# Patient Record
Sex: Male | Born: 1955 | Race: White | Hispanic: No | Marital: Married | Smoking: Former smoker
Health system: Southern US, Community
[De-identification: ages and names within clinical notes are randomized; demographics above are authoritative.]

## PROBLEM LIST (undated history)

## (undated) DIAGNOSIS — N400 Enlarged prostate without lower urinary tract symptoms: Secondary | ICD-10-CM

## (undated) DIAGNOSIS — R7303 Prediabetes: Secondary | ICD-10-CM

## (undated) DIAGNOSIS — C4490 Unspecified malignant neoplasm of skin, unspecified: Secondary | ICD-10-CM

## (undated) DIAGNOSIS — N529 Male erectile dysfunction, unspecified: Secondary | ICD-10-CM

## (undated) DIAGNOSIS — J302 Other seasonal allergic rhinitis: Secondary | ICD-10-CM

## (undated) DIAGNOSIS — I493 Ventricular premature depolarization: Secondary | ICD-10-CM

## (undated) DIAGNOSIS — H5440 Blindness, one eye, unspecified eye: Secondary | ICD-10-CM

## (undated) DIAGNOSIS — M4802 Spinal stenosis, cervical region: Secondary | ICD-10-CM

## (undated) DIAGNOSIS — Z87898 Personal history of other specified conditions: Secondary | ICD-10-CM

## (undated) DIAGNOSIS — R001 Bradycardia, unspecified: Secondary | ICD-10-CM

## (undated) DIAGNOSIS — Z973 Presence of spectacles and contact lenses: Secondary | ICD-10-CM

## (undated) DIAGNOSIS — M75101 Unspecified rotator cuff tear or rupture of right shoulder, not specified as traumatic: Secondary | ICD-10-CM

## (undated) DIAGNOSIS — E785 Hyperlipidemia, unspecified: Secondary | ICD-10-CM

## (undated) DIAGNOSIS — K429 Umbilical hernia without obstruction or gangrene: Secondary | ICD-10-CM

## (undated) DIAGNOSIS — M1990 Unspecified osteoarthritis, unspecified site: Secondary | ICD-10-CM

## (undated) DIAGNOSIS — T4145XA Adverse effect of unspecified anesthetic, initial encounter: Secondary | ICD-10-CM

## (undated) DIAGNOSIS — I491 Atrial premature depolarization: Secondary | ICD-10-CM

## (undated) DIAGNOSIS — I499 Cardiac arrhythmia, unspecified: Secondary | ICD-10-CM

## (undated) DIAGNOSIS — T8859XA Other complications of anesthesia, initial encounter: Secondary | ICD-10-CM

## (undated) HISTORY — PX: COLONOSCOPY: SHX174

## (undated) HISTORY — PX: EYE SURGERY: SHX253

## (undated) HISTORY — PX: TRANSTHORACIC ECHOCARDIOGRAM: SHX275

## (undated) HISTORY — DX: Benign prostatic hyperplasia without lower urinary tract symptoms: N40.0

## (undated) HISTORY — DX: Male erectile dysfunction, unspecified: N52.9

## (undated) HISTORY — DX: Bradycardia, unspecified: R00.1

## (undated) HISTORY — DX: Prediabetes: R73.03

## (undated) HISTORY — PX: CARDIOVASCULAR STRESS TEST: SHX262

## (undated) HISTORY — PX: KNEE ARTHROSCOPY: SUR90

## (undated) HISTORY — PX: LUMBAR DISC SURGERY: SHX700

## (undated) HISTORY — DX: Hyperlipidemia, unspecified: E78.5

## (undated) HISTORY — DX: Blindness, one eye, unspecified eye: H54.40

## (undated) HISTORY — DX: Cardiac arrhythmia, unspecified: I49.9

---

## 2004-05-14 ENCOUNTER — Ambulatory Visit (HOSPITAL_COMMUNITY): Admission: RE | Admit: 2004-05-14 | Discharge: 2004-05-14 | Payer: Self-pay | Admitting: Specialist

## 2016-03-17 DIAGNOSIS — R7301 Impaired fasting glucose: Secondary | ICD-10-CM | POA: Diagnosis not present

## 2016-03-17 DIAGNOSIS — E785 Hyperlipidemia, unspecified: Secondary | ICD-10-CM | POA: Diagnosis not present

## 2016-03-17 DIAGNOSIS — Z1211 Encounter for screening for malignant neoplasm of colon: Secondary | ICD-10-CM | POA: Diagnosis not present

## 2016-03-17 DIAGNOSIS — N529 Male erectile dysfunction, unspecified: Secondary | ICD-10-CM | POA: Diagnosis not present

## 2016-03-17 DIAGNOSIS — Z0000 Encounter for general adult medical examination without abnormal findings: Secondary | ICD-10-CM | POA: Diagnosis not present

## 2016-03-17 DIAGNOSIS — R001 Bradycardia, unspecified: Secondary | ICD-10-CM | POA: Diagnosis not present

## 2016-03-17 DIAGNOSIS — Z125 Encounter for screening for malignant neoplasm of prostate: Secondary | ICD-10-CM | POA: Diagnosis not present

## 2016-05-20 DIAGNOSIS — M542 Cervicalgia: Secondary | ICD-10-CM | POA: Diagnosis not present

## 2016-05-20 DIAGNOSIS — M546 Pain in thoracic spine: Secondary | ICD-10-CM | POA: Diagnosis not present

## 2016-06-11 ENCOUNTER — Other Ambulatory Visit: Payer: Self-pay | Admitting: Sports Medicine

## 2016-06-11 DIAGNOSIS — M542 Cervicalgia: Secondary | ICD-10-CM

## 2016-06-21 ENCOUNTER — Ambulatory Visit
Admission: RE | Admit: 2016-06-21 | Discharge: 2016-06-21 | Disposition: A | Discharge status: Home / Self Care | Payer: Self-pay | Source: Ambulatory Visit | Attending: Sports Medicine | Admitting: Sports Medicine

## 2016-06-21 DIAGNOSIS — M542 Cervicalgia: Secondary | ICD-10-CM

## 2016-07-01 DIAGNOSIS — M542 Cervicalgia: Secondary | ICD-10-CM | POA: Diagnosis not present

## 2016-07-01 DIAGNOSIS — M546 Pain in thoracic spine: Secondary | ICD-10-CM | POA: Diagnosis not present

## 2016-07-01 DIAGNOSIS — M9981 Other biomechanical lesions of cervical region: Secondary | ICD-10-CM | POA: Diagnosis not present

## 2016-08-01 DIAGNOSIS — M50822 Other cervical disc disorders at C5-C6 level: Secondary | ICD-10-CM | POA: Diagnosis not present

## 2016-08-01 DIAGNOSIS — M4802 Spinal stenosis, cervical region: Secondary | ICD-10-CM | POA: Diagnosis not present

## 2016-08-01 DIAGNOSIS — M50823 Other cervical disc disorders at C6-C7 level: Secondary | ICD-10-CM | POA: Diagnosis not present

## 2016-08-01 DIAGNOSIS — M50821 Other cervical disc disorders at C4-C5 level: Secondary | ICD-10-CM | POA: Diagnosis not present

## 2016-08-12 DIAGNOSIS — M542 Cervicalgia: Secondary | ICD-10-CM | POA: Diagnosis not present

## 2016-08-17 DIAGNOSIS — M542 Cervicalgia: Secondary | ICD-10-CM | POA: Diagnosis not present

## 2016-08-19 DIAGNOSIS — M4802 Spinal stenosis, cervical region: Secondary | ICD-10-CM | POA: Diagnosis not present

## 2016-08-21 DIAGNOSIS — M542 Cervicalgia: Secondary | ICD-10-CM | POA: Diagnosis not present

## 2016-11-24 DIAGNOSIS — M722 Plantar fascial fibromatosis: Secondary | ICD-10-CM | POA: Diagnosis not present

## 2016-12-08 DIAGNOSIS — M9901 Segmental and somatic dysfunction of cervical region: Secondary | ICD-10-CM | POA: Diagnosis not present

## 2016-12-08 DIAGNOSIS — M50322 Other cervical disc degeneration at C5-C6 level: Secondary | ICD-10-CM | POA: Diagnosis not present

## 2016-12-08 DIAGNOSIS — Q72812 Congenital shortening of left lower limb: Secondary | ICD-10-CM | POA: Diagnosis not present

## 2016-12-08 DIAGNOSIS — M9905 Segmental and somatic dysfunction of pelvic region: Secondary | ICD-10-CM | POA: Diagnosis not present

## 2016-12-09 DIAGNOSIS — M50322 Other cervical disc degeneration at C5-C6 level: Secondary | ICD-10-CM | POA: Diagnosis not present

## 2016-12-09 DIAGNOSIS — Q72812 Congenital shortening of left lower limb: Secondary | ICD-10-CM | POA: Diagnosis not present

## 2016-12-09 DIAGNOSIS — M9905 Segmental and somatic dysfunction of pelvic region: Secondary | ICD-10-CM | POA: Diagnosis not present

## 2016-12-09 DIAGNOSIS — M9901 Segmental and somatic dysfunction of cervical region: Secondary | ICD-10-CM | POA: Diagnosis not present

## 2016-12-10 DIAGNOSIS — M50322 Other cervical disc degeneration at C5-C6 level: Secondary | ICD-10-CM | POA: Diagnosis not present

## 2016-12-10 DIAGNOSIS — M9905 Segmental and somatic dysfunction of pelvic region: Secondary | ICD-10-CM | POA: Diagnosis not present

## 2016-12-10 DIAGNOSIS — Q72812 Congenital shortening of left lower limb: Secondary | ICD-10-CM | POA: Diagnosis not present

## 2016-12-10 DIAGNOSIS — M9901 Segmental and somatic dysfunction of cervical region: Secondary | ICD-10-CM | POA: Diagnosis not present

## 2016-12-11 DIAGNOSIS — M9901 Segmental and somatic dysfunction of cervical region: Secondary | ICD-10-CM | POA: Diagnosis not present

## 2016-12-11 DIAGNOSIS — Q72812 Congenital shortening of left lower limb: Secondary | ICD-10-CM | POA: Diagnosis not present

## 2016-12-11 DIAGNOSIS — M9905 Segmental and somatic dysfunction of pelvic region: Secondary | ICD-10-CM | POA: Diagnosis not present

## 2016-12-11 DIAGNOSIS — M50322 Other cervical disc degeneration at C5-C6 level: Secondary | ICD-10-CM | POA: Diagnosis not present

## 2016-12-14 DIAGNOSIS — Q72812 Congenital shortening of left lower limb: Secondary | ICD-10-CM | POA: Diagnosis not present

## 2016-12-14 DIAGNOSIS — M50322 Other cervical disc degeneration at C5-C6 level: Secondary | ICD-10-CM | POA: Diagnosis not present

## 2016-12-14 DIAGNOSIS — M9905 Segmental and somatic dysfunction of pelvic region: Secondary | ICD-10-CM | POA: Diagnosis not present

## 2016-12-14 DIAGNOSIS — M9901 Segmental and somatic dysfunction of cervical region: Secondary | ICD-10-CM | POA: Diagnosis not present

## 2016-12-15 DIAGNOSIS — M50322 Other cervical disc degeneration at C5-C6 level: Secondary | ICD-10-CM | POA: Diagnosis not present

## 2016-12-15 DIAGNOSIS — M9905 Segmental and somatic dysfunction of pelvic region: Secondary | ICD-10-CM | POA: Diagnosis not present

## 2016-12-15 DIAGNOSIS — Q72812 Congenital shortening of left lower limb: Secondary | ICD-10-CM | POA: Diagnosis not present

## 2016-12-15 DIAGNOSIS — M9901 Segmental and somatic dysfunction of cervical region: Secondary | ICD-10-CM | POA: Diagnosis not present

## 2016-12-18 DIAGNOSIS — Q72812 Congenital shortening of left lower limb: Secondary | ICD-10-CM | POA: Diagnosis not present

## 2016-12-18 DIAGNOSIS — M50322 Other cervical disc degeneration at C5-C6 level: Secondary | ICD-10-CM | POA: Diagnosis not present

## 2016-12-18 DIAGNOSIS — M9901 Segmental and somatic dysfunction of cervical region: Secondary | ICD-10-CM | POA: Diagnosis not present

## 2016-12-18 DIAGNOSIS — M9905 Segmental and somatic dysfunction of pelvic region: Secondary | ICD-10-CM | POA: Diagnosis not present

## 2016-12-21 DIAGNOSIS — M50322 Other cervical disc degeneration at C5-C6 level: Secondary | ICD-10-CM | POA: Diagnosis not present

## 2016-12-21 DIAGNOSIS — M9905 Segmental and somatic dysfunction of pelvic region: Secondary | ICD-10-CM | POA: Diagnosis not present

## 2016-12-21 DIAGNOSIS — Q72812 Congenital shortening of left lower limb: Secondary | ICD-10-CM | POA: Diagnosis not present

## 2016-12-21 DIAGNOSIS — M9901 Segmental and somatic dysfunction of cervical region: Secondary | ICD-10-CM | POA: Diagnosis not present

## 2016-12-22 DIAGNOSIS — M9901 Segmental and somatic dysfunction of cervical region: Secondary | ICD-10-CM | POA: Diagnosis not present

## 2016-12-22 DIAGNOSIS — M50322 Other cervical disc degeneration at C5-C6 level: Secondary | ICD-10-CM | POA: Diagnosis not present

## 2016-12-22 DIAGNOSIS — Q72812 Congenital shortening of left lower limb: Secondary | ICD-10-CM | POA: Diagnosis not present

## 2016-12-22 DIAGNOSIS — M9905 Segmental and somatic dysfunction of pelvic region: Secondary | ICD-10-CM | POA: Diagnosis not present

## 2016-12-25 DIAGNOSIS — M9901 Segmental and somatic dysfunction of cervical region: Secondary | ICD-10-CM | POA: Diagnosis not present

## 2016-12-25 DIAGNOSIS — M50322 Other cervical disc degeneration at C5-C6 level: Secondary | ICD-10-CM | POA: Diagnosis not present

## 2016-12-25 DIAGNOSIS — M9905 Segmental and somatic dysfunction of pelvic region: Secondary | ICD-10-CM | POA: Diagnosis not present

## 2016-12-25 DIAGNOSIS — Q72812 Congenital shortening of left lower limb: Secondary | ICD-10-CM | POA: Diagnosis not present

## 2016-12-28 DIAGNOSIS — M9905 Segmental and somatic dysfunction of pelvic region: Secondary | ICD-10-CM | POA: Diagnosis not present

## 2016-12-28 DIAGNOSIS — M9901 Segmental and somatic dysfunction of cervical region: Secondary | ICD-10-CM | POA: Diagnosis not present

## 2016-12-28 DIAGNOSIS — M50322 Other cervical disc degeneration at C5-C6 level: Secondary | ICD-10-CM | POA: Diagnosis not present

## 2016-12-28 DIAGNOSIS — Q72812 Congenital shortening of left lower limb: Secondary | ICD-10-CM | POA: Diagnosis not present

## 2016-12-29 DIAGNOSIS — Q72812 Congenital shortening of left lower limb: Secondary | ICD-10-CM | POA: Diagnosis not present

## 2016-12-29 DIAGNOSIS — M9901 Segmental and somatic dysfunction of cervical region: Secondary | ICD-10-CM | POA: Diagnosis not present

## 2016-12-29 DIAGNOSIS — M50322 Other cervical disc degeneration at C5-C6 level: Secondary | ICD-10-CM | POA: Diagnosis not present

## 2016-12-29 DIAGNOSIS — M9905 Segmental and somatic dysfunction of pelvic region: Secondary | ICD-10-CM | POA: Diagnosis not present

## 2017-01-01 DIAGNOSIS — M9901 Segmental and somatic dysfunction of cervical region: Secondary | ICD-10-CM | POA: Diagnosis not present

## 2017-01-01 DIAGNOSIS — M50322 Other cervical disc degeneration at C5-C6 level: Secondary | ICD-10-CM | POA: Diagnosis not present

## 2017-01-01 DIAGNOSIS — Q72812 Congenital shortening of left lower limb: Secondary | ICD-10-CM | POA: Diagnosis not present

## 2017-01-01 DIAGNOSIS — M9905 Segmental and somatic dysfunction of pelvic region: Secondary | ICD-10-CM | POA: Diagnosis not present

## 2017-01-05 DIAGNOSIS — M9901 Segmental and somatic dysfunction of cervical region: Secondary | ICD-10-CM | POA: Diagnosis not present

## 2017-01-05 DIAGNOSIS — Q72812 Congenital shortening of left lower limb: Secondary | ICD-10-CM | POA: Diagnosis not present

## 2017-01-05 DIAGNOSIS — M50322 Other cervical disc degeneration at C5-C6 level: Secondary | ICD-10-CM | POA: Diagnosis not present

## 2017-01-05 DIAGNOSIS — M9905 Segmental and somatic dysfunction of pelvic region: Secondary | ICD-10-CM | POA: Diagnosis not present

## 2017-01-08 DIAGNOSIS — Q72812 Congenital shortening of left lower limb: Secondary | ICD-10-CM | POA: Diagnosis not present

## 2017-01-08 DIAGNOSIS — M9901 Segmental and somatic dysfunction of cervical region: Secondary | ICD-10-CM | POA: Diagnosis not present

## 2017-01-08 DIAGNOSIS — M9905 Segmental and somatic dysfunction of pelvic region: Secondary | ICD-10-CM | POA: Diagnosis not present

## 2017-01-08 DIAGNOSIS — M50322 Other cervical disc degeneration at C5-C6 level: Secondary | ICD-10-CM | POA: Diagnosis not present

## 2017-01-12 DIAGNOSIS — M9901 Segmental and somatic dysfunction of cervical region: Secondary | ICD-10-CM | POA: Diagnosis not present

## 2017-01-12 DIAGNOSIS — M50322 Other cervical disc degeneration at C5-C6 level: Secondary | ICD-10-CM | POA: Diagnosis not present

## 2017-01-12 DIAGNOSIS — M9905 Segmental and somatic dysfunction of pelvic region: Secondary | ICD-10-CM | POA: Diagnosis not present

## 2017-01-12 DIAGNOSIS — Q72812 Congenital shortening of left lower limb: Secondary | ICD-10-CM | POA: Diagnosis not present

## 2017-01-15 DIAGNOSIS — M50322 Other cervical disc degeneration at C5-C6 level: Secondary | ICD-10-CM | POA: Diagnosis not present

## 2017-01-15 DIAGNOSIS — Q72812 Congenital shortening of left lower limb: Secondary | ICD-10-CM | POA: Diagnosis not present

## 2017-01-15 DIAGNOSIS — M9901 Segmental and somatic dysfunction of cervical region: Secondary | ICD-10-CM | POA: Diagnosis not present

## 2017-01-15 DIAGNOSIS — M9905 Segmental and somatic dysfunction of pelvic region: Secondary | ICD-10-CM | POA: Diagnosis not present

## 2017-01-22 DIAGNOSIS — M9901 Segmental and somatic dysfunction of cervical region: Secondary | ICD-10-CM | POA: Diagnosis not present

## 2017-01-22 DIAGNOSIS — M9905 Segmental and somatic dysfunction of pelvic region: Secondary | ICD-10-CM | POA: Diagnosis not present

## 2017-01-22 DIAGNOSIS — Q72812 Congenital shortening of left lower limb: Secondary | ICD-10-CM | POA: Diagnosis not present

## 2017-01-22 DIAGNOSIS — M50322 Other cervical disc degeneration at C5-C6 level: Secondary | ICD-10-CM | POA: Diagnosis not present

## 2017-01-28 DIAGNOSIS — M9901 Segmental and somatic dysfunction of cervical region: Secondary | ICD-10-CM | POA: Diagnosis not present

## 2017-01-28 DIAGNOSIS — Q72812 Congenital shortening of left lower limb: Secondary | ICD-10-CM | POA: Diagnosis not present

## 2017-01-28 DIAGNOSIS — M9905 Segmental and somatic dysfunction of pelvic region: Secondary | ICD-10-CM | POA: Diagnosis not present

## 2017-01-28 DIAGNOSIS — M50322 Other cervical disc degeneration at C5-C6 level: Secondary | ICD-10-CM | POA: Diagnosis not present

## 2017-02-02 DIAGNOSIS — M9905 Segmental and somatic dysfunction of pelvic region: Secondary | ICD-10-CM | POA: Diagnosis not present

## 2017-02-02 DIAGNOSIS — M50322 Other cervical disc degeneration at C5-C6 level: Secondary | ICD-10-CM | POA: Diagnosis not present

## 2017-02-02 DIAGNOSIS — M9901 Segmental and somatic dysfunction of cervical region: Secondary | ICD-10-CM | POA: Diagnosis not present

## 2017-02-02 DIAGNOSIS — Q72812 Congenital shortening of left lower limb: Secondary | ICD-10-CM | POA: Diagnosis not present

## 2017-02-09 DIAGNOSIS — M9905 Segmental and somatic dysfunction of pelvic region: Secondary | ICD-10-CM | POA: Diagnosis not present

## 2017-02-09 DIAGNOSIS — Q72812 Congenital shortening of left lower limb: Secondary | ICD-10-CM | POA: Diagnosis not present

## 2017-02-09 DIAGNOSIS — M50322 Other cervical disc degeneration at C5-C6 level: Secondary | ICD-10-CM | POA: Diagnosis not present

## 2017-02-09 DIAGNOSIS — M9901 Segmental and somatic dysfunction of cervical region: Secondary | ICD-10-CM | POA: Diagnosis not present

## 2017-02-22 DIAGNOSIS — M9901 Segmental and somatic dysfunction of cervical region: Secondary | ICD-10-CM | POA: Diagnosis not present

## 2017-02-22 DIAGNOSIS — M9905 Segmental and somatic dysfunction of pelvic region: Secondary | ICD-10-CM | POA: Diagnosis not present

## 2017-02-22 DIAGNOSIS — Q72812 Congenital shortening of left lower limb: Secondary | ICD-10-CM | POA: Diagnosis not present

## 2017-02-22 DIAGNOSIS — M50322 Other cervical disc degeneration at C5-C6 level: Secondary | ICD-10-CM | POA: Diagnosis not present

## 2017-03-19 DIAGNOSIS — R030 Elevated blood-pressure reading, without diagnosis of hypertension: Secondary | ICD-10-CM | POA: Diagnosis not present

## 2017-03-19 DIAGNOSIS — Z0000 Encounter for general adult medical examination without abnormal findings: Secondary | ICD-10-CM | POA: Diagnosis not present

## 2017-03-19 DIAGNOSIS — Z1211 Encounter for screening for malignant neoplasm of colon: Secondary | ICD-10-CM | POA: Diagnosis not present

## 2017-03-19 DIAGNOSIS — R7303 Prediabetes: Secondary | ICD-10-CM | POA: Diagnosis not present

## 2017-03-19 DIAGNOSIS — Z125 Encounter for screening for malignant neoplasm of prostate: Secondary | ICD-10-CM | POA: Diagnosis not present

## 2017-03-19 DIAGNOSIS — E7800 Pure hypercholesterolemia, unspecified: Secondary | ICD-10-CM | POA: Diagnosis not present

## 2017-03-19 DIAGNOSIS — N529 Male erectile dysfunction, unspecified: Secondary | ICD-10-CM | POA: Diagnosis not present

## 2017-03-29 DIAGNOSIS — M5412 Radiculopathy, cervical region: Secondary | ICD-10-CM | POA: Diagnosis not present

## 2017-03-29 DIAGNOSIS — M542 Cervicalgia: Secondary | ICD-10-CM | POA: Diagnosis not present

## 2017-03-30 ENCOUNTER — Other Ambulatory Visit: Payer: Self-pay | Admitting: Neurosurgery

## 2017-03-30 DIAGNOSIS — M5412 Radiculopathy, cervical region: Secondary | ICD-10-CM

## 2017-04-09 ENCOUNTER — Ambulatory Visit
Admission: RE | Admit: 2017-04-09 | Discharge: 2017-04-09 | Disposition: A | Discharge status: Home / Self Care | Payer: BLUE CROSS/BLUE SHIELD | Source: Ambulatory Visit | Attending: Neurosurgery | Admitting: Neurosurgery

## 2017-04-09 DIAGNOSIS — M5412 Radiculopathy, cervical region: Secondary | ICD-10-CM

## 2017-04-09 DIAGNOSIS — M4802 Spinal stenosis, cervical region: Secondary | ICD-10-CM | POA: Diagnosis not present

## 2017-04-20 DIAGNOSIS — M5412 Radiculopathy, cervical region: Secondary | ICD-10-CM | POA: Diagnosis not present

## 2017-05-04 DIAGNOSIS — M542 Cervicalgia: Secondary | ICD-10-CM | POA: Diagnosis not present

## 2017-05-04 DIAGNOSIS — M6281 Muscle weakness (generalized): Secondary | ICD-10-CM | POA: Diagnosis not present

## 2017-05-04 DIAGNOSIS — M79602 Pain in left arm: Secondary | ICD-10-CM | POA: Diagnosis not present

## 2017-05-06 DIAGNOSIS — M79602 Pain in left arm: Secondary | ICD-10-CM | POA: Diagnosis not present

## 2017-05-06 DIAGNOSIS — M542 Cervicalgia: Secondary | ICD-10-CM | POA: Diagnosis not present

## 2017-05-06 DIAGNOSIS — M6281 Muscle weakness (generalized): Secondary | ICD-10-CM | POA: Diagnosis not present

## 2017-05-11 DIAGNOSIS — M79602 Pain in left arm: Secondary | ICD-10-CM | POA: Diagnosis not present

## 2017-05-11 DIAGNOSIS — H2511 Age-related nuclear cataract, right eye: Secondary | ICD-10-CM | POA: Diagnosis not present

## 2017-05-11 DIAGNOSIS — M542 Cervicalgia: Secondary | ICD-10-CM | POA: Diagnosis not present

## 2017-05-11 DIAGNOSIS — M6281 Muscle weakness (generalized): Secondary | ICD-10-CM | POA: Diagnosis not present

## 2017-05-25 DIAGNOSIS — M5412 Radiculopathy, cervical region: Secondary | ICD-10-CM | POA: Diagnosis not present

## 2017-06-15 DIAGNOSIS — M50821 Other cervical disc disorders at C4-C5 level: Secondary | ICD-10-CM | POA: Diagnosis not present

## 2017-06-15 DIAGNOSIS — M542 Cervicalgia: Secondary | ICD-10-CM | POA: Diagnosis not present

## 2017-06-23 DIAGNOSIS — R001 Bradycardia, unspecified: Secondary | ICD-10-CM | POA: Diagnosis not present

## 2017-06-23 DIAGNOSIS — I493 Ventricular premature depolarization: Secondary | ICD-10-CM | POA: Diagnosis not present

## 2017-06-23 DIAGNOSIS — I499 Cardiac arrhythmia, unspecified: Secondary | ICD-10-CM | POA: Diagnosis not present

## 2017-07-01 DIAGNOSIS — M50322 Other cervical disc degeneration at C5-C6 level: Secondary | ICD-10-CM | POA: Diagnosis not present

## 2017-07-01 DIAGNOSIS — M9901 Segmental and somatic dysfunction of cervical region: Secondary | ICD-10-CM | POA: Diagnosis not present

## 2017-07-01 DIAGNOSIS — M9905 Segmental and somatic dysfunction of pelvic region: Secondary | ICD-10-CM | POA: Diagnosis not present

## 2017-07-01 DIAGNOSIS — Q72812 Congenital shortening of left lower limb: Secondary | ICD-10-CM | POA: Diagnosis not present

## 2017-07-08 DIAGNOSIS — M542 Cervicalgia: Secondary | ICD-10-CM | POA: Diagnosis not present

## 2017-07-08 DIAGNOSIS — M5412 Radiculopathy, cervical region: Secondary | ICD-10-CM | POA: Diagnosis not present

## 2017-07-13 DIAGNOSIS — I517 Cardiomegaly: Secondary | ICD-10-CM

## 2017-07-13 DIAGNOSIS — I340 Nonrheumatic mitral (valve) insufficiency: Secondary | ICD-10-CM

## 2017-07-13 HISTORY — PX: ANTERIOR CERVICAL DECOMP/DISCECTOMY FUSION: SHX1161

## 2017-07-13 HISTORY — DX: Cardiomegaly: I51.7

## 2017-07-13 HISTORY — DX: Nonrheumatic mitral (valve) insufficiency: I34.0

## 2017-07-30 DIAGNOSIS — M50223 Other cervical disc displacement at C6-C7 level: Secondary | ICD-10-CM | POA: Diagnosis not present

## 2017-07-30 DIAGNOSIS — M4802 Spinal stenosis, cervical region: Secondary | ICD-10-CM | POA: Diagnosis not present

## 2017-07-30 DIAGNOSIS — M4722 Other spondylosis with radiculopathy, cervical region: Secondary | ICD-10-CM | POA: Diagnosis not present

## 2017-08-05 DIAGNOSIS — R330 Drug induced retention of urine: Secondary | ICD-10-CM | POA: Diagnosis not present

## 2017-08-06 DIAGNOSIS — R330 Drug induced retention of urine: Secondary | ICD-10-CM | POA: Diagnosis not present

## 2017-08-10 DIAGNOSIS — R330 Drug induced retention of urine: Secondary | ICD-10-CM | POA: Diagnosis not present

## 2017-08-24 DIAGNOSIS — N5201 Erectile dysfunction due to arterial insufficiency: Secondary | ICD-10-CM | POA: Diagnosis not present

## 2017-08-24 DIAGNOSIS — Z125 Encounter for screening for malignant neoplasm of prostate: Secondary | ICD-10-CM | POA: Diagnosis not present

## 2017-08-24 DIAGNOSIS — R3914 Feeling of incomplete bladder emptying: Secondary | ICD-10-CM | POA: Diagnosis not present

## 2017-08-27 DIAGNOSIS — M542 Cervicalgia: Secondary | ICD-10-CM | POA: Diagnosis not present

## 2017-10-08 DIAGNOSIS — M25511 Pain in right shoulder: Secondary | ICD-10-CM | POA: Diagnosis not present

## 2017-10-08 DIAGNOSIS — M25562 Pain in left knee: Secondary | ICD-10-CM | POA: Diagnosis not present

## 2017-10-19 DIAGNOSIS — M25562 Pain in left knee: Secondary | ICD-10-CM | POA: Diagnosis not present

## 2017-10-23 DIAGNOSIS — M25562 Pain in left knee: Secondary | ICD-10-CM | POA: Diagnosis not present

## 2017-11-03 DIAGNOSIS — M2342 Loose body in knee, left knee: Secondary | ICD-10-CM | POA: Diagnosis not present

## 2017-11-03 DIAGNOSIS — M1712 Unilateral primary osteoarthritis, left knee: Secondary | ICD-10-CM | POA: Diagnosis not present

## 2017-11-11 NOTE — Progress Notes (Signed)
Cardiology Office Note   Date:  11/12/2017   ID:  Logan Diaz, DOB 1956/06/13, MRN [id number]  PCP:  Antony Contras, MD  Cardiologist:   Jenkins Rouge, MD   No chief complaint on file.     History of Present Illness: Logan Diaz is a 62 y.o. male who presents for consultation regarding palpitations. Sees Dr Moreen Fowler  As primary but appt indicates self referral History of prediabetes, HLD, BPH and PVC's on ECG He is asymptomatic and wife concerned more about hearing his irregular heart beat when her head is on his chest  I take care of his older brother Logan Diaz who has PAF and a non ischemic DCM.    Patient had cervical spine fusion with Logan Diaz and anesthesia noted PVC;s Was preop for left knee surgery with Dr Theda Sers and noted to have bigeminy and surgery cancelled  No chest pain, syncope palpitations dyspnea or other cardiac symptoms   Past Medical History:  Diagnosis Date  . 1 minute Apgar score 0   . Blindness of left eye    SECONDARY TO A MAGNOLIA TREE PRICKLY BUD ACCIDENT AT AGE 55  . BPH (benign prostatic hyperplasia)   . Bradycardia   . ED (erectile dysfunction)   . Elevated LDL cholesterol level   . Frequent PVCs   . Hyperlipidemia   . Irregular heart beat   . Prediabetes     Past Surgical History:  Procedure Laterality Date  . EYE SURGERY       Current Outpatient Medications  Medication Sig Dispense Refill  . finasteride (PROSCAR) 5 MG tablet Take 5 mg by mouth daily.  0  . fluticasone (FLONASE) 50 MCG/ACT nasal spray Place into both nostrils daily.    Marland Kitchen MAGNESIUM PO Take by mouth. 1-2 TABLETS BY MOUTH DAILY    . NON FORMULARY SUPPLEMENT APS III    . Omega-3 Fatty Acids (FISH OIL PO) Take by mouth. TAKE 2 CAPSULE DAILY    . sildenafil (REVATIO) 20 MG tablet 20 mg. TAKE 2-5 TABLETS ONCE A DAY AS NEEDED , ONE HOUR PRIOR TO SEX    . tamsulosin (FLOMAX) 0.4 MG CAPS capsule as directed.  0   No current facility-administered medications for this  visit.     Allergies:   Patient has no known allergies.    Social History:  The patient  reports that he has quit smoking. He has never used smokeless tobacco. He reports that he does not drink alcohol or use drugs.   Family History:  The patient's family history is not on file.    ROS:  Please see the history of present illness.   Otherwise, review of systems are positive for none.   All other systems are reviewed and negative.    PHYSICAL EXAM: VS:  BP (!) 148/82 Comment: had multiple cups of coffee today  Pulse (!) 44   Ht 6\' 1"  (1.854 m)   Wt 234 lb 8 oz (106.4 kg)   SpO2 96%   BMI 30.94 kg/m  , BMI Body mass index is 30.94 kg/m. Affect appropriate Healthy:  appears stated age 68: ptosis and blind left eye  Neck supple with no adenopathy JVP normal no bruits no thyromegaly Lungs clear with no wheezing and good diaphragmatic motion Heart:  S1/S2 no murmur, no rub, gallop or click PMI normal Abdomen: benighn, BS positve, no tenderness, no AAA no bruit.  No HSM or HJR Distal pulses intact with no bruits No edema Neuro  non-focal Skin warm and dry No muscular weakness    EKG:  SR bigeminy QT 400 normal ST segments    Recent Labs: No results found for requested labs within last 8760 hours.    Lipid Panel No results found for: CHOL, TRIG, HDL, CHOLHDL, VLDL, LDLCALC, LDLDIRECT    Wt Readings from Last 3 Encounters:  11/12/17 234 lb 8 oz (106.4 kg)      Other studies Reviewed: Additional studies/ records that were reviewed today include: Notes primary ECG Notes from anesthesia.    ASSESSMENT AND PLAN:  1.  PVC;s :  Although asymptomatic seem frequent and brother has history of non ischemic DCM.  Will order TTE and 48 hour holter. Will also order Exercise myovue to see if PVCls suppress or worsen with activity and r/o CAD Given left knee issue may need to convert to [facility] but patient would like to try and walk Suspect if no structural heart issue  found will start beta blocker and clear for surgery with outpatient f/u    Current medicines are reviewed at length with the patient today.  The patient does not have concerns regarding medicines.  The following changes have been made:  no change  Labs/ tests ordered today include: TTE, Ex Myovue 48 hour holter   Orders Placed This Encounter  Procedures  . MYOCARDIAL PERFUSION IMAGING  . HOLTER MONITOR - 48 HOUR  . EKG 12-Lead  . ECHOCARDIOGRAM COMPLETE     Disposition:   FU with me in 3 months      Signed, Jenkins Rouge, MD  11/12/2017 11:42 AM    [facility] [place], [place], [place]  [address] Phone: [phone]; Fax: [phone]

## 2017-11-12 ENCOUNTER — Encounter (INDEPENDENT_AMBULATORY_CARE_PROVIDER_SITE_OTHER): Payer: Self-pay

## 2017-11-12 ENCOUNTER — Ambulatory Visit (INDEPENDENT_AMBULATORY_CARE_PROVIDER_SITE_OTHER): Payer: BLUE CROSS/BLUE SHIELD | Admitting: Cardiovascular Disease

## 2017-11-12 ENCOUNTER — Encounter: Payer: Self-pay | Admitting: Cardiovascular Disease

## 2017-11-12 ENCOUNTER — Telehealth (HOSPITAL_COMMUNITY): Payer: Self-pay | Admitting: *Deleted

## 2017-11-12 VITALS — BP 148/82 | HR 44 | Ht 73.0 in | Wt 234.5 lb

## 2017-11-12 DIAGNOSIS — R002 Palpitations: Secondary | ICD-10-CM

## 2017-11-12 DIAGNOSIS — Z01818 Encounter for other preprocedural examination: Secondary | ICD-10-CM

## 2017-11-12 NOTE — Patient Instructions (Addendum)
Medication Instructions:  Your physician recommends that you continue on your current medications as directed. Please refer to the Current Medication list given to you today.  Labwork: NONE  Testing/Procedures: Your physician has requested that you have an echocardiogram. Echocardiography is a painless test that uses sound waves to create images of your heart. It provides your doctor with information about the size and shape of your heart and how well your heart's chambers and valves are working. This procedure takes approximately one hour. There are no restrictions for this procedure.  Your physician has requested that you have en exercise stress myoview. For further information please visit [email or web address]. Please follow instruction sheet, as given.  Your physician has recommended that you wear a holter monitor. Holter monitors are medical devices that record the heart's electrical activity. Doctors most often use these monitors to diagnose arrhythmias. Arrhythmias are problems with the speed or rhythm of the heartbeat. The monitor is a small, portable device. You can wear one while you do your normal daily activities. This is usually used to diagnose what is causing palpitations/syncope (passing out).  Follow-Up: Your physician wants you to follow-up in: 3 months with Dr. Johnsie Cancel.   If you need a refill on your cardiac medications before your next appointment, please call your pharmacy.

## 2017-11-12 NOTE — Telephone Encounter (Signed)
Patient given detailed instructions per Myocardial Perfusion Study Information Sheet for the test on 11/15/17 at 7:30. Patient notified to arrive 15 minutes early and that it is imperative to arrive on time for appointment to keep from having the test rescheduled.  If you need to cancel or reschedule your appointment, please call the office within 24 hours of your appointment. . Patient verbalized understanding.Logan Diaz

## 2017-11-15 ENCOUNTER — Ambulatory Visit (HOSPITAL_COMMUNITY): Payer: BLUE CROSS/BLUE SHIELD

## 2017-11-15 ENCOUNTER — Ambulatory Visit (INDEPENDENT_AMBULATORY_CARE_PROVIDER_SITE_OTHER): Payer: BLUE CROSS/BLUE SHIELD

## 2017-11-15 ENCOUNTER — Ambulatory Visit (HOSPITAL_BASED_OUTPATIENT_CLINIC_OR_DEPARTMENT_OTHER): Payer: BLUE CROSS/BLUE SHIELD

## 2017-11-15 ENCOUNTER — Ambulatory Visit (HOSPITAL_COMMUNITY): Payer: BLUE CROSS/BLUE SHIELD | Attending: Cardiology

## 2017-11-15 ENCOUNTER — Other Ambulatory Visit: Payer: Self-pay

## 2017-11-15 VITALS — Ht 73.0 in | Wt 234.0 lb

## 2017-11-15 DIAGNOSIS — R002 Palpitations: Secondary | ICD-10-CM

## 2017-11-15 DIAGNOSIS — I088 Other rheumatic multiple valve diseases: Secondary | ICD-10-CM | POA: Insufficient documentation

## 2017-11-15 DIAGNOSIS — Z01818 Encounter for other preprocedural examination: Secondary | ICD-10-CM

## 2017-11-15 DIAGNOSIS — I493 Ventricular premature depolarization: Secondary | ICD-10-CM | POA: Insufficient documentation

## 2017-11-15 DIAGNOSIS — R7303 Prediabetes: Secondary | ICD-10-CM | POA: Diagnosis not present

## 2017-11-15 DIAGNOSIS — E785 Hyperlipidemia, unspecified: Secondary | ICD-10-CM | POA: Insufficient documentation

## 2017-11-15 LAB — MYOCARDIAL PERFUSION IMAGING
Estimated workload: 7 METS
Exercise duration (min): 6 min
MPHR: 161 {beats}/min
Peak HR: 148 {beats}/min
Percent HR: 91 %
RATE: 0.4
RPE: 18
Rest HR: 67 {beats}/min
SDS: 4
SRS: 3
SSS: 7
TID: 1.11

## 2017-11-15 MED ORDER — PERFLUTREN LIPID MICROSPHERE
1.0000 mL | INTRAVENOUS | Status: AC | PRN
  Administered 2017-11-15: 2 mL via INTRAVENOUS

## 2017-11-15 MED ORDER — TECHNETIUM TC 99M TETROFOSMIN IV KIT
10.2000 | PACK | Freq: Once | INTRAVENOUS | Status: AC | PRN
  Administered 2017-11-15: 10.2 via INTRAVENOUS
  Filled 2017-11-15: qty 11

## 2017-11-15 MED ORDER — TECHNETIUM TC 99M TETROFOSMIN IV KIT
32.2000 | PACK | Freq: Once | INTRAVENOUS | Status: AC | PRN
  Administered 2017-11-15: 32.2 via INTRAVENOUS
  Filled 2017-11-15: qty 33

## 2017-11-16 ENCOUNTER — Other Ambulatory Visit (HOSPITAL_COMMUNITY): Payer: BLUE CROSS/BLUE SHIELD

## 2017-11-29 NOTE — H&P (View-Only) (Signed)
Electrophysiology Office Note   Date:  11/30/2017   ID:  Logan Diaz, DOB December 13, 1955, MRN [id number]  PCP:  Antony Contras, MD  Cardiologist: Johnsie Cancel Primary Electrophysiologist:  Sharri Loya Meredith Leeds, MD    Chief Complaint  Patient presents with  . Advice Only    PVC's/Bradycardia     History of Present Illness: Logan Diaz is a 62 y.o. male who is being seen today for the evaluation of PVCs at the request of Jenkins Rouge. Presenting today for electrophysiology evaluation.  He has a history of hyperlipidemia and PVCs.  He was being evaluated for left knee surgery.  The anesthesiologist found a high burden of PVCs and felt that he needed cardiac evaluation prior to surgery.  He currently has no chest pain, shortness of breath, palpitations.  He feels well without major complaint today.  Today, he denies symptoms of palpitations, chest pain, shortness of breath, orthopnea, PND, lower extremity edema, claudication, dizziness, presyncope, syncope, bleeding, or neurologic sequela. The patient is tolerating medications without difficulties.    Past Medical History:  Diagnosis Date  . 1 minute Apgar score 0   . Blindness of left eye    SECONDARY TO A MAGNOLIA TREE PRICKLY BUD ACCIDENT AT AGE 50  . BPH (benign prostatic hyperplasia)   . Bradycardia   . ED (erectile dysfunction)   . Elevated LDL cholesterol level   . Frequent PVCs   . Hyperlipidemia   . Irregular heart beat   . Prediabetes    Past Surgical History:  Procedure Laterality Date  . EYE SURGERY       Current Outpatient Medications  Medication Sig Dispense Refill  . finasteride (PROSCAR) 5 MG tablet Take 5 mg by mouth daily.  0  . fluticasone (FLONASE) 50 MCG/ACT nasal spray Place into both nostrils daily.    Marland Kitchen MAGNESIUM PO Take by mouth. 1-2 TABLETS BY MOUTH DAILY    . NON FORMULARY SUPPLEMENT APS III    . Omega-3 Fatty Acids (FISH OIL PO) Take by mouth. TAKE 2 CAPSULE DAILY    . sildenafil (REVATIO)  20 MG tablet 20 mg. TAKE 2-5 TABLETS ONCE A DAY AS NEEDED , ONE HOUR PRIOR TO SEX    . tamsulosin (FLOMAX) 0.4 MG CAPS capsule as directed.  0  . flecainide (TAMBOCOR) 100 MG tablet Take 1 tablet (100 mg total) by mouth 2 (two) times daily. 60 tablet 3   No current facility-administered medications for this visit.     Allergies:   Patient has no known allergies.   Social History:  The patient  reports that he has quit smoking. He has never used smokeless tobacco. He reports that he does not drink alcohol or use drugs.   Family History:  The patient's family history includes Healthy in his daughter, father, mother, and son.   ROS:  Please see the history of present illness.   Otherwise, review of systems is positive for back pain, muscle pain, joint swelling.   All other systems are reviewed and negative.    PHYSICAL EXAM: VS:  BP 134/82   Pulse (!) 38   Ht 6\' 1"  (1.854 m)   Wt 237 lb 6.4 oz (107.7 kg)   SpO2 97%   BMI 31.32 kg/m  , BMI Body mass index is 31.32 kg/m. GEN: Well nourished, well developed, in no acute distress  HEENT: normal  Neck: no JVD, carotid bruits, or masses Cardiac: iRRR; no murmurs, rubs, or gallops,no edema  Respiratory:  clear  to auscultation bilaterally, normal work of breathing GI: soft, nontender, nondistended, + BS MS: no deformity or atrophy  Skin: warm and dry Neuro:  Strength and sensation are intact Psych: euthymic mood, full affect  EKG:  EKG is not ordered today. Personal review of the ekg ordered 11/12/17 shows sinus rhythm with ventricular bigeminy  Recent Labs: No results found for requested labs within last 8760 hours.    Lipid Panel  No results found for: CHOL, TRIG, HDL, CHOLHDL, VLDL, LDLCALC, LDLDIRECT   Wt Readings from Last 3 Encounters:  11/30/17 237 lb 6.4 oz (107.7 kg)  11/15/17 234 lb (106.1 kg)  11/12/17 234 lb 8 oz (106.4 kg)      Other studies Reviewed: Additional studies/ records that were reviewed today include:  TTE 11/15/17  Review of the above records today demonstrates:  - Left ventricle: The cavity size was normal. Wall thickness was   increased in a pattern of mild LVH. Systolic function was normal.   The estimated ejection fraction was in the range of 55% to 60%.   Wall motion was normal; there were no regional wall motion   abnormalities. Left ventricular diastolic function parameters   were normal. - Mitral valve: There was mild regurgitation. - Atrial septum: No defect or patent foramen ovale was identified.  Holter 11/15/17 - personally reviewed NSR PVC;s 26% of beats couplets occasional NSVT only 3 beats  PACs  SPECT 11/15/17  Blood pressure demonstrated a normal response to exercise.  There was no ST segment deviation noted during stress.  Defect 1: There is a medium defect of moderate severity present in the basal inferoseptal, mid inferoseptal and apex location.  This is a low risk study.   Abnormal, low risk stress nuclear study with probable inferoseptal and apical thinning but no significant ischemia; study not gated due to ectopy; suggest echo to assess LV function.   ASSESSMENT AND PLAN:  1.  PVCs: Patient [name] Holter monitor with 26% PVCs.  Fortunately, his echo shows a normal ejection fraction.  Due to his high burden of PVCs, I discussed with him the possibility of medical management versus ablation.  He would like to try medical management initially.  We [name] start him on flecainide 100 mg twice a day.  We [name] have a stress test afterwards.  He is planning to undergo knee surgery on his left knee.  He would likely be at a intermediate risk for this intermediate risk procedure.  With his normal ejection fraction, would not need any further evaluation.  I do feel that his bradycardia is likely due to PVCs and compensatory pauses instead of significant other issues.   Current medicines are reviewed at length with the patient today.   The patient does not have concerns  regarding his medicines.  The following changes were made today:  none  Labs/ tests ordered today include:  Orders Placed This Encounter  Procedures  . Exercise Tolerance Test     Disposition:   FU with Kue Fox 3 months  Signed, Irean Kendricks Meredith Leeds, MD  11/30/2017 11:07 AM     [facility] [address] [place] [address] [phone] (office) [phone] (fax)

## 2017-11-29 NOTE — Progress Notes (Signed)
Electrophysiology Office Note   Date:  11/30/2017   ID:  Logan Diaz, DOB 1956/01/09, MRN [id number]  PCP:  Antony Contras, MD  Cardiologist: Johnsie Cancel Primary Electrophysiologist:  Kaylah Chiasson Meredith Leeds, MD    Chief Complaint  Patient presents with  . Advice Only    PVC's/Bradycardia     History of Present Illness: MCCLELLAN DEMARAIS is a 62 y.o. male who is being seen today for the evaluation of PVCs at the request of Jenkins Rouge. Presenting today for electrophysiology evaluation.  He has a history of hyperlipidemia and PVCs.  He was being evaluated for left knee surgery.  The anesthesiologist found a high burden of PVCs and felt that he needed cardiac evaluation prior to surgery.  He currently has no chest pain, shortness of breath, palpitations.  He feels well without major complaint today.  Today, he denies symptoms of palpitations, chest pain, shortness of breath, orthopnea, PND, lower extremity edema, claudication, dizziness, presyncope, syncope, bleeding, or neurologic sequela. The patient is tolerating medications without difficulties.    Past Medical History:  Diagnosis Date  . 1 minute Apgar score 0   . Blindness of left eye    SECONDARY TO A MAGNOLIA TREE PRICKLY BUD ACCIDENT AT AGE 83  . BPH (benign prostatic hyperplasia)   . Bradycardia   . ED (erectile dysfunction)   . Elevated LDL cholesterol level   . Frequent PVCs   . Hyperlipidemia   . Irregular heart beat   . Prediabetes    Past Surgical History:  Procedure Laterality Date  . EYE SURGERY       Current Outpatient Medications  Medication Sig Dispense Refill  . finasteride (PROSCAR) 5 MG tablet Take 5 mg by mouth daily.  0  . fluticasone (FLONASE) 50 MCG/ACT nasal spray Place into both nostrils daily.    Marland Kitchen MAGNESIUM PO Take by mouth. 1-2 TABLETS BY MOUTH DAILY    . NON FORMULARY SUPPLEMENT APS III    . Omega-3 Fatty Acids (FISH OIL PO) Take by mouth. TAKE 2 CAPSULE DAILY    . sildenafil (REVATIO)  20 MG tablet 20 mg. TAKE 2-5 TABLETS ONCE A DAY AS NEEDED , ONE HOUR PRIOR TO SEX    . tamsulosin (FLOMAX) 0.4 MG CAPS capsule as directed.  0  . flecainide (TAMBOCOR) 100 MG tablet Take 1 tablet (100 mg total) by mouth 2 (two) times daily. 60 tablet 3   No current facility-administered medications for this visit.     Allergies:   Patient has no known allergies.   Social History:  The patient  reports that he has quit smoking. He has never used smokeless tobacco. He reports that he does not drink alcohol or use drugs.   Family History:  The patient's family history includes Healthy in his daughter, father, mother, and son.   ROS:  Please see the history of present illness.   Otherwise, review of systems is positive for back pain, muscle pain, joint swelling.   All other systems are reviewed and negative.    PHYSICAL EXAM: VS:  BP 134/82   Pulse (!) 38   Ht 6\' 1"  (1.854 m)   Wt 237 lb 6.4 oz (107.7 kg)   SpO2 97%   BMI 31.32 kg/m  , BMI Body mass index is 31.32 kg/m. GEN: Well nourished, well developed, in no acute distress  HEENT: normal  Neck: no JVD, carotid bruits, or masses Cardiac: iRRR; no murmurs, rubs, or gallops,no edema  Respiratory:  clear  to auscultation bilaterally, normal work of breathing GI: soft, nontender, nondistended, + BS MS: no deformity or atrophy  Skin: warm and dry Neuro:  Strength and sensation are intact Psych: euthymic mood, full affect  EKG:  EKG is not ordered today. Personal review of the ekg ordered 11/12/17 shows sinus rhythm with ventricular bigeminy  Recent Labs: No results found for requested labs within last 8760 hours.    Lipid Panel  No results found for: CHOL, TRIG, HDL, CHOLHDL, VLDL, LDLCALC, LDLDIRECT   Wt Readings from Last 3 Encounters:  11/30/17 237 lb 6.4 oz (107.7 kg)  11/15/17 234 lb (106.1 kg)  11/12/17 234 lb 8 oz (106.4 kg)      Other studies Reviewed: Additional studies/ records that were reviewed today include:  TTE 11/15/17  Review of the above records today demonstrates:  - Left ventricle: The cavity size was normal. Wall thickness was   increased in a pattern of mild LVH. Systolic function was normal.   The estimated ejection fraction was in the range of 55% to 60%.   Wall motion was normal; there were no regional wall motion   abnormalities. Left ventricular diastolic function parameters   were normal. - Mitral valve: There was mild regurgitation. - Atrial septum: No defect or patent foramen ovale was identified.  Holter 11/15/17 - personally reviewed NSR PVC;s 26% of beats couplets occasional NSVT only 3 beats  PACs  SPECT 11/15/17  Blood pressure demonstrated a normal response to exercise.  There was no ST segment deviation noted during stress.  Defect 1: There is a medium defect of moderate severity present in the basal inferoseptal, mid inferoseptal and apex location.  This is a low risk study.   Abnormal, low risk stress nuclear study with probable inferoseptal and apical thinning but no significant ischemia; study not gated due to ectopy; suggest echo to assess LV function.   ASSESSMENT AND PLAN:  1.  PVCs: Patient Adolphe Fortunato Holter monitor with 26% PVCs.  Fortunately, his echo shows a normal ejection fraction.  Due to his high burden of PVCs, I discussed with him the possibility of medical management versus ablation.  He would like to try medical management initially.  We Kayen Grabel start him on flecainide 100 mg twice a day.  We Adryan Shin have a stress test afterwards.  He is planning to undergo knee surgery on his left knee.  He would likely be at a intermediate risk for this intermediate risk procedure.  With his normal ejection fraction, would not need any further evaluation.  I do feel that his bradycardia is likely due to PVCs and compensatory pauses instead of significant other issues.   Current medicines are reviewed at length with the patient today.   The patient does not have concerns  regarding his medicines.  The following changes were made today:  none  Labs/ tests ordered today include:  Orders Placed This Encounter  Procedures  . Exercise Tolerance Test     Disposition:   FU with Jeven Topper 3 months  Signed, Anesia Blackwell Meredith Leeds, MD  11/30/2017 11:07 AM     [facility] [address] [place] [address] [phone] (office) [phone] (fax)

## 2017-11-30 ENCOUNTER — Encounter: Payer: Self-pay | Admitting: Cardiology

## 2017-11-30 ENCOUNTER — Ambulatory Visit (INDEPENDENT_AMBULATORY_CARE_PROVIDER_SITE_OTHER): Payer: BLUE CROSS/BLUE SHIELD | Admitting: Cardiology

## 2017-11-30 VITALS — BP 134/82 | HR 38 | Ht 73.0 in | Wt 237.4 lb

## 2017-11-30 DIAGNOSIS — I493 Ventricular premature depolarization: Secondary | ICD-10-CM | POA: Diagnosis not present

## 2017-11-30 MED ORDER — FLECAINIDE ACETATE 100 MG PO TABS: 100.0000 mg | ORAL_TABLET | Freq: Two times a day (BID) | ORAL | 3 refills | Status: DC

## 2017-11-30 NOTE — Patient Instructions (Addendum)
Medication Instructions: Start Flecainide 100 mg twice daily (you will start this 7-10 days prior to stress testing)  Labwork: None ordered  Procedures/Testing: Your physician has requested that you have an exercise tolerance test (you will start Flecainide 7-10 days prior to this testing). For further information please visit [email or web address]. Please also follow instruction sheet, as given.    Follow-Up: Your physician recommends that you schedule a follow-up appointment in: 3 months with Dr. Curt Bears.   Thank you for choosing [facility]!!   Logan Curet, RN [phone]  Any Additional Special Instructions Will Be Listed Below   Flecainide tablets What is this medicine? FLECAINIDE (FLEK a nide) is an antiarrhythmic drug. This medicine is used to prevent irregular heart rhythm. It can also slow down fast heartbeats called tachycardia. This medicine may be used for other purposes; ask your health care provider or pharmacist if you have questions. COMMON BRAND NAME(S): Tambocor What should I tell my health care provider before I take this medicine? They need to know if you have any of these conditions: -abnormal levels of potassium in the blood -heart disease including heart rhythm and heart rate problems -kidney or liver disease -recent heart attack -an unusual or allergic reaction to flecainide, local anesthetics, other medicines, foods, dyes, or preservatives -pregnant or trying to get pregnant -breast-feeding How should I use this medicine? Take this medicine by mouth with a glass of water. Follow the directions on the prescription label. You can take this medicine with or without food. Take your doses at regular intervals. Do not take your medicine more often than directed. Do not stop taking this medicine suddenly. This may cause serious, heart-related side effects. If your doctor wants you to stop the medicine, the dose may be slowly lowered over time to avoid any  side effects. Talk to your pediatrician regarding the use of this medicine in children. While this drug may be prescribed for children as young as 1 year of age for selected conditions, precautions do apply. Overdosage: If you think you have taken too much of this medicine contact a poison control center or emergency room at once. NOTE: This medicine is only for you. Do not share this medicine with others. What if I miss a dose? If you miss a dose, take it as soon as you can. If it is almost time for your next dose, take only that dose. Do not take double or extra doses. What may interact with this medicine? Do not take this medicine with any of the following medications: -amoxapine -arsenic trioxide -certain antibiotics like clarithromycin, erythromycin, gatifloxacin, gemifloxacin, levofloxacin, moxifloxacin, sparfloxacin, or troleandomycin -certain antidepressants called tricyclic antidepressants like amitriptyline, imipramine, or nortriptyline -certain medicines to control heart rhythm like disopyramide, dofetilide, encainide, moricizine, procainamide, propafenone, and quinidine -cisapride -cyclobenzaprine -delavirdine -droperidol -haloperidol -hawthorn -imatinib -levomethadyl -maprotiline -medicines for malaria like chloroquine and halofantrine -pentamidine -phenothiazines like chlorpromazine, mesoridazine, prochlorperazine, thioridazine -pimozide -quinine -ranolazine -ritonavir -sertindole -ziprasidone This medicine may also interact with the following medications: -cimetidine -medicines for angina or high blood pressure -medicines to control heart rhythm like amiodarone and digoxin This list may not describe all possible interactions. Give your health care provider a list of all the medicines, herbs, non-prescription drugs, or dietary supplements you use. Also tell them if you smoke, drink alcohol, or use illegal drugs. Some items may interact with your medicine. What should  I watch for while using this medicine? Visit your doctor or health care professional for regular checks on your progress. Because  your condition and the use of this medicine carries some risk, it is a good idea to carry an identification card, necklace or bracelet with details of your condition, medications and doctor or health care professional. Check your blood pressure and pulse rate regularly. Ask your health care professional what your blood pressure and pulse rate should be, and when you should contact him or her. Your doctor or health care professional also may schedule regular blood tests and electrocardiograms to check your progress. You may get drowsy or dizzy. Do not drive, use machinery, or do anything that needs mental alertness until you know how this medicine affects you. Do not stand or sit up quickly, especially if you are an older patient. This reduces the risk of dizzy or fainting spells. Alcohol can make you more dizzy, increase flushing and rapid heartbeats. Avoid alcoholic drinks. What side effects may I notice from receiving this medicine? Side effects that you should report to your doctor or health care professional as soon as possible: -chest pain, continued irregular heartbeats -difficulty breathing -swelling of the legs or feet -trembling, shaking -unusually weak or tired Side effects that usually do not require medical attention (report to your doctor or health care professional if they continue or are bothersome): -blurred vision -constipation -headache -nausea, vomiting -stomach pain This list may not describe all possible side effects. Call your doctor for medical advice about side effects. You may report side effects to FDA at 1-800-FDA-1088. Where should I keep my medicine? Keep out of the reach of children. Store at room temperature between 15 and 30 degrees C (59 and 86 degrees F). Protect from light. Keep container tightly closed. Throw away any unused medicine  after the expiration date. NOTE: This sheet is a summary. It may not cover all possible information. If you have questions about this medicine, talk to your doctor, pharmacist, or health care provider.  2018 Elsevier/Gold Standard (2007-11-02 16:46:09)    Exercise Stress Electrocardiogram An exercise stress electrocardiogram is a test that is done to evaluate the blood supply to your heart. This test may also be called exercise stress electrocardiography. The test is done while you are walking on a treadmill. The goal of this test is to raise your heart rate. This test is done to find areas of poor blood flow to the heart by determining the extent of coronary artery disease (CAD). CAD is defined as narrowing in one or more heart (coronary) arteries of more than 70%. If you have an abnormal test result, this may mean that you are not getting adequate blood flow to your heart during exercise. Additional testing may be needed to understand why your test was abnormal. Tell a health care provider about:  Any allergies you have.  All medicines you are taking, including vitamins, herbs, eye drops, creams, and over-the-counter medicines.  Any problems you or family members have had with anesthetic medicines.  Any blood disorders you have.  Any surgeries you have had.  Any medical conditions you have.  Possibility of pregnancy, if this applies. What are the risks? Generally, this is a safe procedure. However, as with any procedure, complications can occur. Possible complications can include:  Pain or pressure in the following areas: ? Chest. ? Jaw or neck. ? Between your shoulder blades. ? Radiating down your left arm.  Dizziness or light-headedness.  Shortness of breath.  Increased or irregular heartbeats.  Nausea or vomiting.  Heart attack (rare).  What happens before the procedure?  Avoid all  forms of caffeine 24 hours before your test or as directed by your health care  provider. This includes coffee, tea (even decaffeinated tea), caffeinated sodas, chocolate, cocoa, and certain pain medicines.  Follow your health care provider's instructions regarding eating and drinking before the test.  Take your medicines as directed at regular times with water unless instructed otherwise. Exceptions may include: ? If you have diabetes, ask how you are to take your insulin or pills. It is common to adjust insulin dosing the morning of the test. ? If you are taking beta-blocker medicines, it is important to talk to your health care provider about these medicines well before the date of your test. Taking beta-blocker medicines may interfere with the test. In some cases, these medicines need to be changed or stopped 24 hours or more before the test. ? If you wear a nitroglycerin patch, it may need to be removed prior to the test. Ask your health care provider if the patch should be removed before the test.  If you use an inhaler for any breathing condition, bring it with you to the test.  If you are an outpatient, bring a snack so you can eat right after the stress phase of the test.  Do not smoke for 4 hours prior to the test or as directed by your health care provider.  Do not apply lotions, powders, creams, or oils on your chest prior to the test.  Wear loose-fitting clothes and comfortable shoes for the test. This test involves walking on a treadmill. What happens during the procedure?  Multiple patches (electrodes) will be put on your chest. If needed, small areas of your chest may have to be shaved to get better contact with the electrodes. Once the electrodes are attached to your body, multiple wires will be attached to the electrodes and your heart rate will be monitored.  Your heart will be monitored both at rest and while exercising.  You will walk on a treadmill. The treadmill will be started at a slow pace. The treadmill speed and incline will gradually be  increased to raise your heart rate. What happens after the procedure?  Your heart rate and blood pressure will be monitored after the test.  You may return to your normal schedule including diet, activities, and medicines, unless your health care provider tells you otherwise. This information is not intended to replace advice given to you by your health care provider. Make sure you discuss any questions you have with your health care provider. Document Released: 06/26/2000 Document Revised: 12/05/2015 Document Reviewed: 03/06/2013 Elsevier Interactive Patient Education  2017 [employer].

## 2017-12-02 ENCOUNTER — Telehealth: Payer: Self-pay

## 2017-12-02 NOTE — Telephone Encounter (Signed)
   [facility] Pre-operative Risk Assessment    Request for surgical clearance:  1. What type of surgery is being performed? Left Knee: Left knee scope, deb, remove loss bodies, Left: KA-debridement/shaving (chondroplasty)   2. When is this surgery scheduled? Pending   3. What type of clearance is required (medical clearance vs. Pharmacy clearance to hold med vs. Both)? Medical Clearance  4. Are there any medications that need to be held prior to surgery and how long? NONE   5. Practice name and name of physician performing surgery? [facility], Dr. Hart Robinsons  6. What is your office phone number [phone]      7.   What is your office fax number [phone] Logan Diaz  8.   Anesthesia type (None, local, MAC, general) ?  General anesthesia with a knee block   Logan Diaz 12/02/2017, 5:20 PM

## 2017-12-03 NOTE — Telephone Encounter (Signed)
Pt has been started on Flecainide and needs stress test prior to clearance.  This is scheduled for 12/10/17 then we will address.  This is for PVCs

## 2017-12-08 NOTE — Telephone Encounter (Signed)
   Primary Cardiologist: Will Meredith Leeds, MD  Chart reviewed as part of pre-operative protocol coverage. See below - patient is awaiting stress test before clearance can be provided. I don't see this was sent to callback staff. Callback staff, please call [facility] to make them aware we received clearance but that further recs will depend on results on cardiac testing.  Charlie Pitter, PA-C 12/08/2017, 2:21 PM

## 2017-12-08 NOTE — Telephone Encounter (Signed)
Sent fax over to [employer] @ [facility] to let her know that pt will have a stress test before he can be cleared for sx.

## 2017-12-10 ENCOUNTER — Ambulatory Visit (INDEPENDENT_AMBULATORY_CARE_PROVIDER_SITE_OTHER): Payer: BLUE CROSS/BLUE SHIELD

## 2017-12-10 DIAGNOSIS — I493 Ventricular premature depolarization: Secondary | ICD-10-CM

## 2017-12-10 LAB — EXERCISE TOLERANCE TEST
CHL CUP RESTING HR STRESS: 61 {beats}/min
CSEPEDS: 1 s
CSEPEW: 8.5 METS
CSEPPHR: 153 {beats}/min
Exercise duration (min): 7 min
MPHR: 161 {beats}/min
Percent HR: 95 %
RPE: 15

## 2017-12-20 ENCOUNTER — Encounter (HOSPITAL_BASED_OUTPATIENT_CLINIC_OR_DEPARTMENT_OTHER): Payer: Self-pay | Admitting: *Deleted

## 2017-12-20 ENCOUNTER — Ambulatory Visit: Payer: Self-pay | Admitting: Orthopedic Surgery

## 2017-12-20 NOTE — Progress Notes (Addendum)
Spoke w/ pt via phone for pre-op interview.  Npo after mn w/ exception clear liquids until 0815 (no cream /milk products).  Needs istat. Arrive at [facility].  Current ekg in chart and epic.  Cardiac clearance w/ lov note from dr Curt Bears, dated 11-30-2017 in chart and epic. Per [facility] pt stay overnight w/ telemetry bed.  ADDENDUM:  REVIEWED CHART W/ DR ROSE MDA, FACE TO FACE, TODAY 12-21-2017 @ 1330. STATED OK TO PROCEED.

## 2017-12-23 ENCOUNTER — Encounter (HOSPITAL_BASED_OUTPATIENT_CLINIC_OR_DEPARTMENT_OTHER): Payer: Self-pay | Admitting: *Deleted

## 2017-12-23 ENCOUNTER — Ambulatory Visit (HOSPITAL_BASED_OUTPATIENT_CLINIC_OR_DEPARTMENT_OTHER): Payer: BLUE CROSS/BLUE SHIELD | Admitting: Anesthesiology

## 2017-12-23 ENCOUNTER — Encounter (HOSPITAL_BASED_OUTPATIENT_CLINIC_OR_DEPARTMENT_OTHER)
Admission: RE | Disposition: A | Discharge status: Home / Self Care | Payer: Self-pay | Source: Ambulatory Visit | Attending: Specialist

## 2017-12-23 ENCOUNTER — Ambulatory Visit (HOSPITAL_BASED_OUTPATIENT_CLINIC_OR_DEPARTMENT_OTHER)
Admission: RE | Admit: 2017-12-23 | Discharge: 2017-12-23 | Disposition: A | Discharge status: Home / Self Care | Payer: BLUE CROSS/BLUE SHIELD | Source: Ambulatory Visit | Attending: Specialist | Admitting: Specialist

## 2017-12-23 ENCOUNTER — Other Ambulatory Visit: Payer: Self-pay

## 2017-12-23 DIAGNOSIS — M23252 Derangement of posterior horn of lateral meniscus due to old tear or injury, left knee: Secondary | ICD-10-CM | POA: Diagnosis not present

## 2017-12-23 DIAGNOSIS — I491 Atrial premature depolarization: Secondary | ICD-10-CM | POA: Insufficient documentation

## 2017-12-23 DIAGNOSIS — H5462 Unqualified visual loss, left eye, normal vision right eye: Secondary | ICD-10-CM | POA: Insufficient documentation

## 2017-12-23 DIAGNOSIS — Z87891 Personal history of nicotine dependence: Secondary | ICD-10-CM | POA: Insufficient documentation

## 2017-12-23 DIAGNOSIS — S83282A Other tear of lateral meniscus, current injury, left knee, initial encounter: Secondary | ICD-10-CM | POA: Diagnosis not present

## 2017-12-23 DIAGNOSIS — I499 Cardiac arrhythmia, unspecified: Secondary | ICD-10-CM | POA: Diagnosis not present

## 2017-12-23 DIAGNOSIS — I493 Ventricular premature depolarization: Secondary | ICD-10-CM | POA: Insufficient documentation

## 2017-12-23 DIAGNOSIS — N400 Enlarged prostate without lower urinary tract symptoms: Secondary | ICD-10-CM | POA: Diagnosis not present

## 2017-12-23 DIAGNOSIS — Z79899 Other long term (current) drug therapy: Secondary | ICD-10-CM | POA: Insufficient documentation

## 2017-12-23 DIAGNOSIS — R7303 Prediabetes: Secondary | ICD-10-CM | POA: Diagnosis not present

## 2017-12-23 DIAGNOSIS — M1712 Unilateral primary osteoarthritis, left knee: Secondary | ICD-10-CM | POA: Insufficient documentation

## 2017-12-23 DIAGNOSIS — M2342 Loose body in knee, left knee: Secondary | ICD-10-CM | POA: Diagnosis not present

## 2017-12-23 DIAGNOSIS — E785 Hyperlipidemia, unspecified: Secondary | ICD-10-CM | POA: Insufficient documentation

## 2017-12-23 DIAGNOSIS — Z9889 Other specified postprocedural states: Secondary | ICD-10-CM

## 2017-12-23 HISTORY — DX: Unspecified osteoarthritis, unspecified site: M19.90

## 2017-12-23 HISTORY — DX: Atrial premature depolarization: I49.1

## 2017-12-23 HISTORY — DX: Ventricular premature depolarization: I49.3

## 2017-12-23 HISTORY — DX: Personal history of other specified conditions: Z87.898

## 2017-12-23 HISTORY — PX: KNEE ARTHROSCOPY: SHX127

## 2017-12-23 HISTORY — DX: Other seasonal allergic rhinitis: J30.2

## 2017-12-23 HISTORY — DX: Presence of spectacles and contact lenses: Z97.3

## 2017-12-23 LAB — POCT I-STAT 4, (NA,K, GLUC, HGB,HCT)
GLUCOSE: 108 mg/dL — AB (ref 65–99)
HEMATOCRIT: 46 % (ref 39.0–52.0)
Hemoglobin: 15.6 g/dL (ref 13.0–17.0)
POTASSIUM: 4.4 mmol/L (ref 3.5–5.1)
SODIUM: 142 mmol/L (ref 135–145)

## 2017-12-23 SURGERY — ARTHROSCOPY, KNEE
Anesthesia: General | Site: Knee | Laterality: Left

## 2017-12-23 MED ORDER — TRIAMCINOLONE ACETONIDE 40 MG/ML IJ SUSP
INTRAMUSCULAR | Status: DC | PRN
  Administered 2017-12-23: 40 mg via INTRAMUSCULAR

## 2017-12-23 MED ORDER — LACTATED RINGERS IV SOLN
INTRAVENOUS | Status: DC
  Administered 2017-12-23: 11:00:00 via INTRAVENOUS
  Filled 2017-12-23: qty 1000

## 2017-12-23 MED ORDER — CEFAZOLIN SODIUM-DEXTROSE 2-4 GM/100ML-% IV SOLN
2.0000 g | INTRAVENOUS | Status: AC
  Administered 2017-12-23: 2 g via INTRAVENOUS
  Filled 2017-12-23: qty 100

## 2017-12-23 MED ORDER — ONDANSETRON HCL 4 MG/2ML IJ SOLN
INTRAMUSCULAR | Status: AC
Start: 2017-12-23 — End: ?
  Filled 2017-12-23: qty 2

## 2017-12-23 MED ORDER — HYDROCODONE-ACETAMINOPHEN 5-325 MG PO TABS: 1.0000 | ORAL_TABLET | ORAL | 0 refills | Status: AC | PRN

## 2017-12-23 MED ORDER — ONDANSETRON HCL 4 MG/2ML IJ SOLN
INTRAMUSCULAR | Status: DC | PRN
  Administered 2017-12-23: 4 mg via INTRAVENOUS

## 2017-12-23 MED ORDER — MEPERIDINE HCL 25 MG/ML IJ SOLN
6.2500 mg | INTRAMUSCULAR | Status: DC | PRN
  Filled 2017-12-23: qty 1

## 2017-12-23 MED ORDER — OXYCODONE HCL 5 MG/5ML PO SOLN
5.0000 mg | Freq: Once | ORAL | Status: DC | PRN
  Filled 2017-12-23: qty 5

## 2017-12-23 MED ORDER — MIDAZOLAM HCL 2 MG/2ML IJ SOLN
INTRAMUSCULAR | Status: AC
  Filled 2017-12-23: qty 2

## 2017-12-23 MED ORDER — DEXAMETHASONE SODIUM PHOSPHATE 10 MG/ML IJ SOLN
INTRAMUSCULAR | Status: AC
Start: 2017-12-23 — End: ?
  Filled 2017-12-23: qty 1

## 2017-12-23 MED ORDER — ASPIRIN EC 325 MG PO TBEC: 325.0000 mg | DELAYED_RELEASE_TABLET | Freq: Two times a day (BID) | ORAL | 0 refills | Status: AC

## 2017-12-23 MED ORDER — CEFAZOLIN SODIUM-DEXTROSE 2-4 GM/100ML-% IV SOLN
INTRAVENOUS | Status: AC
  Filled 2017-12-23: qty 100

## 2017-12-23 MED ORDER — FENTANYL CITRATE (PF) 100 MCG/2ML IJ SOLN
INTRAMUSCULAR | Status: AC
Start: 2017-12-23 — End: ?
  Filled 2017-12-23: qty 2

## 2017-12-23 MED ORDER — CEPHALEXIN 500 MG PO CAPS: 500.0000 mg | ORAL_CAPSULE | Freq: Three times a day (TID) | ORAL | 0 refills | Status: DC

## 2017-12-23 MED ORDER — FENTANYL CITRATE (PF) 100 MCG/2ML IJ SOLN
25.0000 ug | INTRAMUSCULAR | Status: DC | PRN
  Filled 2017-12-23: qty 1

## 2017-12-23 MED ORDER — FENTANYL CITRATE (PF) 100 MCG/2ML IJ SOLN
INTRAMUSCULAR | Status: DC | PRN
  Administered 2017-12-23: 50 ug via INTRAVENOUS
  Administered 2017-12-23 (×2): 25 ug via INTRAVENOUS

## 2017-12-23 MED ORDER — PROPOFOL 10 MG/ML IV BOLUS
INTRAVENOUS | Status: AC
Start: 2017-12-23 — End: ?
  Filled 2017-12-23: qty 40

## 2017-12-23 MED ORDER — ACETAMINOPHEN 325 MG PO TABS
325.0000 mg | ORAL_TABLET | ORAL | Status: DC | PRN
  Filled 2017-12-23: qty 2

## 2017-12-23 MED ORDER — DEXAMETHASONE SODIUM PHOSPHATE 10 MG/ML IJ SOLN
INTRAMUSCULAR | Status: DC | PRN
  Administered 2017-12-23: 10 mg via INTRAVENOUS

## 2017-12-23 MED ORDER — ONDANSETRON HCL 4 MG/2ML IJ SOLN
4.0000 mg | Freq: Once | INTRAMUSCULAR | Status: DC | PRN
  Filled 2017-12-23: qty 2

## 2017-12-23 MED ORDER — BUPIVACAINE HCL 0.25 % IJ SOLN
INTRAMUSCULAR | Status: DC | PRN
  Administered 2017-12-23: 30 mL

## 2017-12-23 MED ORDER — ACETAMINOPHEN 160 MG/5ML PO SOLN
325.0000 mg | ORAL | Status: DC | PRN
  Filled 2017-12-23: qty 20.3

## 2017-12-23 MED ORDER — MIDAZOLAM HCL 5 MG/5ML IJ SOLN
INTRAMUSCULAR | Status: DC | PRN
  Administered 2017-12-23: 2 mg via INTRAVENOUS

## 2017-12-23 MED ORDER — TRIAMCINOLONE ACETONIDE 40 MG/ML IJ SUSP
INTRAMUSCULAR | Status: AC
  Filled 2017-12-23: qty 1

## 2017-12-23 MED ORDER — OXYCODONE HCL 5 MG PO TABS
5.0000 mg | ORAL_TABLET | Freq: Once | ORAL | Status: DC | PRN
  Filled 2017-12-23: qty 1

## 2017-12-23 MED ORDER — BUPIVACAINE HCL (PF) 0.25 % IJ SOLN
INTRAMUSCULAR | Status: AC
  Filled 2017-12-23: qty 30

## 2017-12-23 MED ORDER — KETOROLAC TROMETHAMINE 30 MG/ML IJ SOLN
INTRAMUSCULAR | Status: DC | PRN
  Administered 2017-12-23: 30 mg via INTRAVENOUS

## 2017-12-23 MED ORDER — SODIUM CHLORIDE 0.9 % IR SOLN
Status: DC | PRN
  Administered 2017-12-23: 6000 mL

## 2017-12-23 MED ORDER — GLYCOPYRROLATE PF 0.2 MG/ML IJ SOSY
PREFILLED_SYRINGE | INTRAMUSCULAR | Status: AC
  Filled 2017-12-23: qty 1

## 2017-12-23 MED ORDER — GLYCOPYRROLATE 0.2 MG/ML IJ SOLN
INTRAMUSCULAR | Status: DC | PRN
  Administered 2017-12-23: 0.2 mg via INTRAVENOUS

## 2017-12-23 MED ORDER — CHLORHEXIDINE GLUCONATE 4 % EX LIQD
60.0000 mL | Freq: Once | CUTANEOUS | Status: DC
  Filled 2017-12-23: qty 118

## 2017-12-23 MED ORDER — KETOROLAC TROMETHAMINE 30 MG/ML IJ SOLN
INTRAMUSCULAR | Status: AC
  Filled 2017-12-23: qty 1

## 2017-12-23 MED ORDER — PROPOFOL 10 MG/ML IV BOLUS
INTRAVENOUS | Status: DC | PRN
  Administered 2017-12-23: 50 mg via INTRAVENOUS
  Administered 2017-12-23: 200 mg via INTRAVENOUS

## 2017-12-23 MED ORDER — MEPERIDINE HCL 50 MG/ML IJ SOLN
INTRAMUSCULAR | Status: AC
  Filled 2017-12-23: qty 1

## 2017-12-23 MED ORDER — MORPHINE SULFATE (PF) 4 MG/ML IV SOLN
INTRAVENOUS | Status: AC
  Filled 2017-12-23: qty 1

## 2017-12-23 MED ORDER — LIDOCAINE HCL (CARDIAC) PF 100 MG/5ML IV SOSY
PREFILLED_SYRINGE | INTRAVENOUS | Status: DC | PRN
  Administered 2017-12-23: 100 mg via INTRAVENOUS

## 2017-12-23 MED ORDER — LIDOCAINE 2% (20 MG/ML) 5 ML SYRINGE
INTRAMUSCULAR | Status: AC
  Filled 2017-12-23: qty 5

## 2017-12-23 MED ORDER — MORPHINE SULFATE (PF) 4 MG/ML IV SOLN
INTRAVENOUS | Status: DC | PRN
  Administered 2017-12-23: 4 mg via INTRAMUSCULAR

## 2017-12-23 SURGICAL SUPPLY — 39 items
BANDAGE ESMARK 6X9 LF (GAUZE/BANDAGES/DRESSINGS) ×1 IMPLANT
BLADE CUDA GRT WHITE 3.5 (BLADE) ×2 IMPLANT
BNDG CMPR 9X6 STRL LF SNTH (GAUZE/BANDAGES/DRESSINGS) ×1
BNDG ESMARK 6X9 LF (GAUZE/BANDAGES/DRESSINGS) ×2
BNDG GAUZE ELAST 4 BULKY (GAUZE/BANDAGES/DRESSINGS) ×2 IMPLANT
CANISTER SUCTION 1200CC (MISCELLANEOUS) ×2 IMPLANT
CONT SPECI 4OZ STER CLIK (MISCELLANEOUS) IMPLANT
CUFF TOURN SGL QUICK 34 (TOURNIQUET CUFF) ×2
CUFF TOURNIQUET SINGLE 34IN LL (TOURNIQUET CUFF) ×2 IMPLANT
CUFF TRNQT CYL 34X4X40X1 (TOURNIQUET CUFF) ×1 IMPLANT
DRAPE ARTHROSCOPY W/POUCH 114 (DRAPES) ×2 IMPLANT
DRAPE INCISE IOBAN 66X45 STRL (DRAPES) ×2 IMPLANT
DRAPE U-SHAPE 47X51 STRL (DRAPES) ×2 IMPLANT
DURAPREP 26ML APPLICATOR (WOUND CARE) ×2 IMPLANT
ELECT MENISCUS 165MM 90D (ELECTRODE) IMPLANT
GAUZE SPONGE 4X4 12PLY STRL (GAUZE/BANDAGES/DRESSINGS) ×2 IMPLANT
GAUZE XEROFORM 1X8 LF (GAUZE/BANDAGES/DRESSINGS) ×2 IMPLANT
GLOVE BIO SURGEON STRL SZ7.5 (GLOVE) ×2 IMPLANT
GLOVE BIO SURGEON STRL SZ8 (GLOVE) ×2 IMPLANT
GLOVE INDICATOR 8.0 STRL GRN (GLOVE) ×4 IMPLANT
GOWN STRL REUS W/TWL XL LVL3 (GOWN DISPOSABLE) ×4 IMPLANT
IV NS IRRIG 3000ML ARTHROMATIC (IV SOLUTION) ×4 IMPLANT
KIT TURNOVER CYSTO (KITS) ×2 IMPLANT
KNEE WRAP E Z 3 GEL PACK (MISCELLANEOUS) ×2 IMPLANT
MANIFOLD NEPTUNE II (INSTRUMENTS) ×2 IMPLANT
NEEDLE HYPO 22GX1.5 SAFETY (NEEDLE) ×2 IMPLANT
PACK ARTHROSCOPY DSU (CUSTOM PROCEDURE TRAY) ×2 IMPLANT
PACK BASIN DAY SURGERY FS (CUSTOM PROCEDURE TRAY) ×2 IMPLANT
PAD ABD 8X10 STRL (GAUZE/BANDAGES/DRESSINGS) ×4 IMPLANT
PAD ARMBOARD 7.5X6 YLW CONV (MISCELLANEOUS) IMPLANT
PROBE BIPOLAR 50 DEGREE SUCT (MISCELLANEOUS) ×2 IMPLANT
PROBE BIPOLAR ATHRO 135MM 90D (MISCELLANEOUS) IMPLANT
SET ARTHROSCOPY TUBING (MISCELLANEOUS) ×2
SET ARTHROSCOPY TUBING LN (MISCELLANEOUS) ×1 IMPLANT
SUT ETHILON 4 0 PS 2 18 (SUTURE) ×2 IMPLANT
SYR CONTROL 10ML LL (SYRINGE) ×2 IMPLANT
TOWEL OR 17X24 6PK STRL BLUE (TOWEL DISPOSABLE) ×4 IMPLANT
TUBE CONNECTING 12X1/4 (SUCTIONS) ×2 IMPLANT
WATER STERILE IRR 500ML POUR (IV SOLUTION) IMPLANT

## 2017-12-23 NOTE — Op Note (Signed)
NAME: Logan, Diaz MEDICAL RECORD [id number] ACCOUNT [id number] DATE OF BIRTH:01-04-1956 FACILITY: [facility] LOCATION: [facility] PHYSICIAN:Oaklan Persons Gwinda Passe, MD  OPERATIVE REPORT  DATE OF PROCEDURE:  12/23/2017  PREOPERATIVE DIAGNOSES: 1.  Left knee osteoarthritis with chondral fragments. 2.  Loose bodies.  POSTOPERATIVE DIAGNOSIS: 1.  Left knee osteoarthritis with chondral fragments 2.  .  Loose bodies. 3.  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  PROCEDURE: 1.  Left knee arthroscopic partial lateral meniscectomy. 2.  Chondroplasty of patellofemoral joint. 3.  Removal of chondral loose bodies.  SURGEON:  Audree Camel. Theda Sers, MD  ASSISTANT:  Bart Stable, PA-C.  ANESTHESIA:  General with intraoperative knee block.  ESTIMATED BLOOD LOSS:  None.  DRAINS:  None.  COMPLICATIONS:  None.  TOURNIQUET TIME:  15 minutes at 300 mmHg.    DISPOSITION:  To PACU stable.  DESCRIPTION OF PROCEDURE:  Prior to the surgical procedure, and forgoing any sedation,  the patient asked me to go home today.  He states his cardiac disposition has been stable and asked me if he was stable during to PACU if he could go home.  He  understands there is taking risk with this.  He accepts that and wishes to proceed to home.  I did tell him if he is stable during the operation and he is stable in the PACU and was seen by the anesthesiologist prior to discharge, he will be fine.  If  not, he will be kept overnight.  He was in agreement with that plan.  Proximal medial cannula was introduced inferolateral and inferomedial.  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  There was also  chondrocalcinosis intercondylar notch.  Medial and lateral compartments was debrided with a shaver.  Loose bodies were encountered in the intercondylar notch and removed with a shaver.   Medial meniscus and articular cartilage was intact.  Lateral side  inspected and cartilage showed mild chondromalacia, but there was a posterior tear of the lateral meniscus.  Utilizing basket and motorized shaver cautery partial lateral meniscectomy to provide a stable base _____ contoured.  The knee was then  thoroughly irrigated.  The scope equipment was removed.  Portal closed with 1 suture.  Another 10 mL of Bupivacaine in the skin,  10 mL placed in the joint.  Sterile dressing applied.  TED hose, ice pack.  No complications.  Dressing applied.  Tourniquet  deflated, normal circulation.  The patient is awakened.  Taken to the operating room table in stable condition.    Patient will be stabilized in the PACU and discharged home if he is stable.  If not, he will be kept overnight.  See him in the office next week.  Start physical therapy.  Prognosis very good.  If he has a flare after surgery, I would recommend  intra-articular corticosteroid for chondrocalcinosis flareup.  AN/NUANCE  D:12/23/2017 T:12/23/2017 JOB:[id number]

## 2017-12-23 NOTE — Op Note (Signed)
Dictated

## 2017-12-23 NOTE — Transfer of Care (Signed)
Immediate Anesthesia Transfer of Care Note  Patient: Logan Diaz  Procedure(s) Performed: Procedure(s) (LRB): Left knee arthroscopy, debridement removal loose bodies, chondroplasty and partial lateral meniscectomy (Left)  Patient Location: PACU  Anesthesia Type: General  Level of Consciousness: awake, sedated, patient cooperative and responds to stimulation   Airway & Oxygen Therapy: Patient Spontanous Breathing and Patient connected to NCO2  Post-op Assessment: Report given to PACU RN, Post -op Vital signs reviewed and stable and Patient moving all extremities  Post vital signs: Reviewed and stable  Complications: No apparent anesthesia complications

## 2017-12-23 NOTE — Discharge Instructions (Signed)
°  Post Anesthesia Home Care Instructions  Activity: Get plenty of rest for the remainder of the day. A responsible individual must stay with you for 24 hours following the procedure.  For the next 24 hours, DO NOT: -Drive a car -Paediatric nurse -Drink alcoholic beverages -Take any medication unless instructed by your physician -Make any legal decisions or sign important papers.  Meals: Start with liquid foods such as gelatin or soup. Progress to regular foods as tolerated. Avoid greasy, spicy, heavy foods. If nausea and/or vomiting occur, drink only clear liquids until the nausea and/or vomiting subsides. Call your physician if vomiting continues.  Special Instructions/Symptoms: Your throat may feel dry or sore from the anesthesia or the breathing tube placed in your throat during surgery. If this causes discomfort, gargle with warm salt water. The discomfort should disappear within 24 hours.  If you had a scopolamine patch placed behind your ear for the management of post- operative nausea and/or vomiting:  1. The medication in the patch is effective for 72 hours, after which it should be removed.  Wrap patch in a tissue and discard in the trash. Wash hands thoroughly with soap and water. 2. You may remove the patch earlier than 72 hours if you experience unpleasant side effects which may include dry mouth, dizziness or visual disturbances. 3. Avoid touching the patch. Wash your hands with soap and water after contact with the patch.     NO ADVIL, ALEVE, MOTRIN, IBUPROFEN , NAPROSYN UNTIL 6 PM TODAY

## 2017-12-23 NOTE — Anesthesia Procedure Notes (Signed)
Procedure Name: LMA Insertion Date/Time: 12/23/2017 11:21 AM Performed by: Justice Rocher, CRNA Pre-anesthesia Checklist: Patient identified, Emergency Drugs available, Suction available and Patient being monitored Patient Re-evaluated:Patient Re-evaluated prior to induction Oxygen Delivery Method: Circle system utilized Preoxygenation: Pre-oxygenation with 100% oxygen Induction Type: IV induction Ventilation: Mask ventilation without difficulty LMA: LMA inserted LMA Size: 5.0 Number of attempts: 1 Airway Equipment and Method: Bite block Placement Confirmation: positive ETCO2 and breath sounds checked- equal and bilateral Tube secured with: Tape Dental Injury: Teeth and Oropharynx as per pre-operative assessment

## 2017-12-23 NOTE — Interval H&P Note (Signed)
History and Physical Interval Note:  12/23/2017 11:01 AM  Logan Diaz  has presented today for surgery, with the diagnosis of Left knee loose bodies, osteoarthritis  The various methods of treatment have been discussed with the patient and family. After consideration of risks, benefits and other options for treatment, the patient has consented to  Procedure(s) with comments: Left knee arthroscopy, debridement removal loose bodies (Left) - 45 mins / to stay over night monitor floor cardiology per [place] as a surgical intervention .  The patient's history has been reviewed, patient examined, no change in status, stable for surgery.  I have reviewed the patient's chart and labs.  Questions were answered to the patient's satisfaction.     Logan Diaz ANDREW

## 2017-12-23 NOTE — Anesthesia Preprocedure Evaluation (Addendum)
Anesthesia Evaluation  Patient identified by MRN, date of birth, ID band Patient awake    Reviewed: Allergy & Precautions, H&P , NPO status , Patient's Chart, lab work & pertinent test results, reviewed documented beta blocker date and time   Airway Mallampati: II  TM Distance: >3 FB Neck ROM: full    Dental no notable dental hx.    Pulmonary neg pulmonary ROS, former smoker,    Pulmonary exam normal breath sounds clear to auscultation       Cardiovascular Exercise Tolerance: Good + dysrhythmias  Rhythm:regular Rate:Normal  Echo 5/19  Study Conclusions  - Left ventricle: The cavity size was normal. Wall thickness was   increased in a pattern of mild LVH. Systolic function was normal.   The estimated ejection fraction was in the range of 55% to 60%.   Wall motion was normal; there were no regional wall motion   abnormalities. Left ventricular diastolic function parameters   were normal. - Mitral valve: There was mild regurgitation. - Atrial septum: No defect or patent foramen ovale was identified.  Myocardial perfusion scan  5/19   Blood pressure demonstrated a normal response to exercise.  There was no ST segment deviation noted during stress.  Defect 1: There is a medium defect of moderate severity present in the basal inferoseptal, mid inferoseptal and apex location.  This is a low risk study.   Holter 5/19 Per Dr. Johnsie Cancel, EF normal non ischemic myovue PVC;s 26% of beats not a candidate for beta blocker due to resting bradycardia   Neuro/Psych negative neurological ROS  negative psych ROS   GI/Hepatic negative GI ROS, Neg liver ROS,   Endo/Other  negative endocrine ROS  Renal/GU negative Renal ROS  negative genitourinary   Musculoskeletal   Abdominal   Peds  Hematology negative hematology ROS (+)   Anesthesia Other Findings   Reproductive/Obstetrics negative OB ROS                              Anesthesia Physical Anesthesia Plan  ASA: III  Anesthesia Plan: General   Post-op Pain Management:    Induction: Intravenous  PONV Risk Score and Plan: 1 and Treatment may vary due to age or medical condition  Airway Management Planned: LMA  Additional Equipment:   Intra-op Plan:   Post-operative Plan: Extubation in OR  Informed Consent: I have reviewed the patients History and Physical, chart, labs and discussed the procedure including the risks, benefits and alternatives for the proposed anesthesia with the patient or authorized representative who has indicated his/her understanding and acceptance.   Dental Advisory Given  Plan Discussed with: CRNA, Surgeon and Anesthesiologist  Anesthesia Plan Comments: ( )        Anesthesia Quick Evaluation

## 2017-12-23 NOTE — Anesthesia Postprocedure Evaluation (Signed)
Anesthesia Post Note  Patient: Logan Diaz  Procedure(s) Performed: Left knee arthroscopy, debridement removal loose bodies, chondroplasty and partial lateral meniscectomy (Left Knee)     Patient location during evaluation: PACU Anesthesia Type: General Level of consciousness: awake and alert Pain management: pain level controlled Vital Signs Assessment: post-procedure vital signs reviewed and stable Respiratory status: spontaneous breathing, nonlabored ventilation, respiratory function stable and patient connected to nasal cannula oxygen Cardiovascular status: blood pressure returned to baseline and stable Postop Assessment: no apparent nausea or vomiting Anesthetic complications: no    Last Vitals:  Vitals:   12/23/17 1300 12/23/17 1315  BP: (!) 144/91 (!) 152/86  Pulse: 67 (!) 49  Resp: 17 13  Temp:    SpO2: 98% 100%    Last Pain:  Vitals:   12/23/17 1315  TempSrc:   PainSc: 0-No pain                 Minta Fair

## 2017-12-23 NOTE — H&P (Signed)
Logan Diaz is an 62 y.o. male.   Chief Complaint: knee pain HPI: Patient presents with joint discomfort that had been persistent for several weeks now. Despite conservative treatments, the patients discomfort has not improved. Imaging was obtained. Other conservative and surgical treatments were discussed in detail. Patient wishes to proceed with surgery as consented. Denies SOB, CP, or calf pain. No Fever, chills, or nausea/ vomiting. History of PVC's.  Past Medical History:  Diagnosis Date  . Blindness of left eye    SECONDARY TO A MAGNOLIA TREE PRICKLY BUD ACCIDENT AT AGE 18  . BPH (benign prostatic hyperplasia)   . Bradycardia   . ED (erectile dysfunction)   . History of exercise intolerance dr Curt Bears   12-10-2017  normal exercise tolerence,  no evidence ishemia by ST segment analysis  . Hyperlipidemia   . Irregular heart beat   . OA (osteoarthritis)    LEFT KNEE  . PAC (premature atrial contraction)   . Prediabetes   . PVCs (premature ventricular contractions)    cardiology EP -- dr Curt Bears--  holter monitor w/  PVCs 26% beats couplets occasional NSVT only 3 beats  . Seasonal allergies   . Wears glasses     Past Surgical History:  Procedure Laterality Date  . ANTERIOR CERVICAL DECOMP/DISCECTOMY FUSION  07/2017   C6-7  . CARDIOVASCULAR STRESS TEST  11-15-2017   dr Johnsie Cancel   Low risk nuclear study w/ probable inferoseptal and apical thinning but no significant ischemia;  study no gated due to ectopy  . EYE SURGERY Left x2 @ age 60   injury--  and  Catarat sx @ age 68 and 57 approx.  Marland Kitchen KNEE ARTHROSCOPY Left 1980s  . [place] SURGERY  1990s  . TRANSTHORACIC ECHOCARDIOGRAM  11-15-2017   dr Johnsie Cancel   mild LVH, ef 55-60%/  mild MR, PR and TR    Family History  Problem Relation Age of Onset  . Healthy Son   . Healthy Daughter   . Healthy Mother   . Healthy Father    Social History:  reports that he quit smoking about 43 years ago. His smoking use included  cigarettes. He quit after 2.00 years of use. He has never used smokeless tobacco. He reports that he drank alcohol. He reports that he does not use drugs.  Allergies: No Known Allergies  Medications Prior to Admission  Medication Sig Dispense Refill  . finasteride (PROSCAR) 5 MG tablet Take 5 mg by mouth every morning.   0  . flecainide (TAMBOCOR) 100 MG tablet Take 1 tablet (100 mg total) by mouth 2 (two) times daily. (Patient taking differently: Take 100 mg by mouth 2 (two) times daily. ) 60 tablet 3  . MAGNESIUM PO Take by mouth. 1-2 TABLETS BY MOUTH DAILY    . NON FORMULARY SUPPLEMENT APS III    . Omega-3 Fatty Acids (FISH OIL PO) Take by mouth daily. TAKE 2 CAPSULE DAILY     . sildenafil (REVATIO) 20 MG tablet 20 mg. TAKE 2-5 TABLETS ONCE A DAY AS NEEDED , ONE HOUR PRIOR TO SEX     . tamsulosin (FLOMAX) 0.4 MG CAPS capsule Take 0.4 mg by mouth daily after breakfast.   0  . fluticasone (FLONASE) 50 MCG/ACT nasal spray Place into both nostrils daily as needed.       Results for orders placed or performed during the hospital encounter of 12/23/17 (from the past 48 hour(s))  I-STAT 4, (NA,K, GLUC, HGB,HCT)  Status: Abnormal   Collection Time: 12/23/17 10:56 AM  Result Value Ref Range   Sodium 142 135 - 145 mmol/L   Potassium 4.4 3.5 - 5.1 mmol/L   Glucose, Bld 108 (H) 65 - 99 mg/dL   HCT 46.0 39.0 - 52.0 %   Hemoglobin 15.6 13.0 - 17.0 g/dL   No results found.  Review of Systems  Constitutional: Negative.   HENT: Negative.   Eyes: Negative.   Respiratory: Negative.   Cardiovascular: Negative for chest pain.  Gastrointestinal: Negative.   Genitourinary: Negative.   Musculoskeletal: Positive for joint pain.  Skin: Negative.   Neurological: Negative.   Endo/Heme/Allergies: Negative.   Psychiatric/Behavioral: Negative.     Blood pressure (!) 142/87, pulse (!) 52, temperature 98.7 F (37.1 C), temperature source Oral, resp. rate 16, height 6\' 1"  (1.854 m), weight 103.4 kg  (227 lb 14.4 oz), SpO2 99 %. Physical Exam  Constitutional: He is oriented to person, place, and time. He appears well-developed.  HENT:  Head: Normocephalic.  Eyes: EOM are normal.  Neck: Normal range of motion.  Cardiovascular: Intact distal pulses.  Respiratory: Effort normal.  GI: Soft.  Genitourinary:  Genitourinary Comments: Deferred  Musculoskeletal:  Pain with ROm of knee. Knee is stable.  Neurological: He is alert and oriented to person, place, and time.  Skin: Skin is warm and dry.  Psychiatric: His behavior is normal.     Assessment/Plan Knee pain: Knee scope as consented Obs with Tele Possible d/c home Follow instructions ASA at D/c   Nieshia Larmon L, PA-C 12/23/2017, 11:22 AM

## 2017-12-24 ENCOUNTER — Encounter (HOSPITAL_BASED_OUTPATIENT_CLINIC_OR_DEPARTMENT_OTHER): Payer: Self-pay | Admitting: Specialist

## 2017-12-31 DIAGNOSIS — M25562 Pain in left knee: Secondary | ICD-10-CM | POA: Diagnosis not present

## 2018-03-10 ENCOUNTER — Ambulatory Visit (INDEPENDENT_AMBULATORY_CARE_PROVIDER_SITE_OTHER): Payer: BLUE CROSS/BLUE SHIELD | Admitting: Cardiology

## 2018-03-10 ENCOUNTER — Encounter: Payer: Self-pay | Admitting: Cardiology

## 2018-03-10 VITALS — BP 128/72 | HR 64 | Ht 73.0 in | Wt 232.8 lb

## 2018-03-10 DIAGNOSIS — I493 Ventricular premature depolarization: Secondary | ICD-10-CM | POA: Diagnosis not present

## 2018-03-10 NOTE — Progress Notes (Signed)
Electrophysiology Office Note   Date:  03/10/2018   ID:  Logan Diaz, DOB 1955-10-08, MRN [id number]  PCP:  Antony Contras, MD  Cardiologist: Johnsie Cancel Primary Electrophysiologist:  Genelle Economou Meredith Leeds, MD    No chief complaint on file.    History of Present Illness: Logan Diaz is a 62 y.o. male who is being seen today for the evaluation of PVCs at the request of Jenkins Rouge. Presenting today for electrophysiology evaluation.  He has a history of hyperlipidemia and PVCs.  He was being evaluated for left knee surgery.  The anesthesiologist found a high burden of PVCs and felt that he needed cardiac evaluation prior to surgery.  He currently has no chest pain, shortness of breath, palpitations.  He feels well without major complaint today.  Today, denies symptoms of palpitations, chest pain, shortness of breath, orthopnea, PND, lower extremity edema, claudication, dizziness, presyncope, syncope, bleeding, or neurologic sequela. The patient is tolerating medications without difficulties.  Is noted no further issues on the flecainide.  He is noted no palpitations.  He is feeling well today without complaint.   Past Medical History:  Diagnosis Date  . Blindness of left eye    SECONDARY TO A MAGNOLIA TREE PRICKLY BUD ACCIDENT AT AGE 24  . BPH (benign prostatic hyperplasia)   . Bradycardia   . ED (erectile dysfunction)   . History of exercise intolerance dr Curt Bears   12-10-2017  normal exercise tolerence,  no evidence ishemia by ST segment analysis  . Hyperlipidemia   . Irregular heart beat   . OA (osteoarthritis)    LEFT KNEE  . PAC (premature atrial contraction)   . Prediabetes   . PVCs (premature ventricular contractions)    cardiology EP -- dr Curt Bears--  holter monitor w/  PVCs 26% beats couplets occasional NSVT only 3 beats  . Seasonal allergies   . Wears glasses    Past Surgical History:  Procedure Laterality Date  . ANTERIOR CERVICAL DECOMP/DISCECTOMY FUSION   07/2017   C6-7  . CARDIOVASCULAR STRESS TEST  11-15-2017   dr Johnsie Cancel   Low risk nuclear study w/ probable inferoseptal and apical thinning but no significant ischemia;  study no gated due to ectopy  . EYE SURGERY Left x2 @ age 73   injury--  and  Catarat sx @ age 33 and 88 approx.  Marland Kitchen KNEE ARTHROSCOPY Left 1980s  . KNEE ARTHROSCOPY Left 12/23/2017   Procedure: Left knee arthroscopy, debridement removal loose bodies, chondroplasty and partial lateral meniscectomy;  Surgeon: Sydnee Cabal, MD;  Location: [facility];  Service: Orthopedics;  Laterality: Left;  45 mins / to stay over night monitor floor cardiology per [facility]  . [place] SURGERY  1990s  . TRANSTHORACIC ECHOCARDIOGRAM  11-15-2017   dr Johnsie Cancel   mild LVH, ef 55-60%/  mild MR, PR and TR     Current Outpatient Medications  Medication Sig Dispense Refill  . cephALEXin (KEFLEX) 500 MG capsule Take 1 capsule (500 mg total) by mouth 3 (three) times daily. 12 capsule 0  . cyclobenzaprine (FLEXERIL) 10 MG tablet Take 1 tablet by mouth as needed for moderate pain.    . finasteride (PROSCAR) 5 MG tablet Take 5 mg by mouth every morning.   0  . flecainide (TAMBOCOR) 100 MG tablet Take 1 tablet (100 mg total) by mouth 2 (two) times daily. (Patient taking differently: Take 100 mg by mouth 2 (two) times daily. ) 60 tablet 3  . fluticasone (FLONASE) 50  MCG/ACT nasal spray Place into both nostrils daily as needed.     Marland Kitchen ibuprofen (ADVIL,MOTRIN) 200 MG tablet Take 3 tablets by mouth as needed for pain.    Marland Kitchen MAGNESIUM PO Take by mouth. 1-2 TABLETS BY MOUTH DAILY    . NON FORMULARY SUPPLEMENT APS III    . Omega-3 Fatty Acids (FISH OIL PO) Take by mouth daily. TAKE 2 CAPSULE DAILY     . sildenafil (REVATIO) 20 MG tablet 20 mg. TAKE 2-5 TABLETS ONCE A DAY AS NEEDED , ONE HOUR PRIOR TO SEX     . tamsulosin (FLOMAX) 0.4 MG CAPS capsule Take 0.4 mg by mouth daily after breakfast.   0   No current facility-administered medications  for this visit.     Allergies:   Patient has no known allergies.   Social History:  The patient  reports that he quit smoking about 43 years ago. His smoking use included cigarettes. He quit after 2.00 years of use. He has never used smokeless tobacco. He reports that he drank alcohol. He reports that he does not use drugs.   Family History:  The patient's family history includes Healthy in his daughter, father, mother, and son.   ROS:  Please see the history of present illness.   Otherwise, review of systems is positive for back pain, muscle pain, snoring.   All other systems are reviewed and negative.   PHYSICAL EXAM: VS:  BP 128/72   Pulse 64   Ht 6\' 1"  (1.854 m)   Wt 232 lb 12.8 oz (105.6 kg)   SpO2 97%   BMI 30.71 kg/m  , BMI Body mass index is 30.71 kg/m. GEN: Well nourished, well developed, in no acute distress  HEENT: normal  Neck: no JVD, carotid bruits, or masses Cardiac: RRR; no murmurs, rubs, or gallops,no edema  Respiratory:  clear to auscultation bilaterally, normal work of breathing GI: soft, nontender, nondistended, + BS MS: no deformity or atrophy  Skin: warm and dry Neuro:  Strength and sensation are intact Psych: euthymic mood, full affect  EKG:  EKG is ordered today. Personal review of the ekg ordered shows SR, rate 64   Recent Labs: 12/23/2017: Hemoglobin 15.6; Potassium 4.4; Sodium 142    Lipid Panel  No results found for: CHOL, TRIG, HDL, CHOLHDL, VLDL, LDLCALC, LDLDIRECT   Wt Readings from Last 3 Encounters:  03/10/18 232 lb 12.8 oz (105.6 kg)  12/23/17 227 lb 14.4 oz (103.4 kg)  11/30/17 237 lb 6.4 oz (107.7 kg)      Other studies Reviewed: Additional studies/ records that were reviewed today include: TTE 11/15/17  Review of the above records today demonstrates:  - Left ventricle: The cavity size was normal. Wall thickness was   increased in a pattern of mild LVH. Systolic function was normal.   The estimated ejection fraction was in the  range of 55% to 60%.   Wall motion was normal; there were no regional wall motion   abnormalities. Left ventricular diastolic function parameters   were normal. - Mitral valve: There was mild regurgitation. - Atrial septum: No defect or patent foramen ovale was identified.  Holter 11/15/17 - personally reviewed NSR PVC;s 26% of beats couplets occasional NSVT only 3 beats  PACs  SPECT 11/15/17  Blood pressure demonstrated a normal response to exercise.  There was no ST segment deviation noted during stress.  Defect 1: There is a medium defect of moderate severity present in the basal inferoseptal, mid inferoseptal and apex  location.  This is a low risk study.   Abnormal, low risk stress nuclear study with probable inferoseptal and apical thinning but no significant ischemia; study not gated due to ectopy; suggest echo to assess LV function.   ETT 12/10/17 - personally reviewed  Blood pressure demonstrated a hypertensive response to exercise.  There was no ST segment deviation noted during stress.   1. Normal exercise tolerance.  2. No evidence for ischemia by ST segment analysis.   ASSESSMENT AND PLAN:  1.  PVCs: Holter monitor with 26% PVCs.  Echo shows a normal ejection fraction.  Due to his high burden of PVCs he was started on flecainide at his last visit.  He is tolerating this without issue.  Having no episodes of palpitations.  Continue with current management.     Current medicines are reviewed at length with the patient today.   The patient does not have concerns regarding his medicines.  The following changes were made today: None  Labs/ tests ordered today include:  Orders Placed This Encounter  Procedures  . EKG 12-Lead     Disposition:   FU with Roderick Calo 6 months  Signed, Kariana Wiles Meredith Leeds, MD  03/10/2018 9:25 AM     [facility] [address] [place] [address] [phone] (office) [phone] (fax)

## 2018-03-10 NOTE — Patient Instructions (Signed)

## 2018-03-24 DIAGNOSIS — L570 Actinic keratosis: Secondary | ICD-10-CM | POA: Diagnosis not present

## 2018-04-01 DIAGNOSIS — E7800 Pure hypercholesterolemia, unspecified: Secondary | ICD-10-CM | POA: Diagnosis not present

## 2018-04-01 DIAGNOSIS — Z125 Encounter for screening for malignant neoplasm of prostate: Secondary | ICD-10-CM | POA: Diagnosis not present

## 2018-04-01 DIAGNOSIS — R7303 Prediabetes: Secondary | ICD-10-CM | POA: Diagnosis not present

## 2018-04-01 DIAGNOSIS — Z0000 Encounter for general adult medical examination without abnormal findings: Secondary | ICD-10-CM | POA: Diagnosis not present

## 2018-04-01 DIAGNOSIS — N529 Male erectile dysfunction, unspecified: Secondary | ICD-10-CM | POA: Diagnosis not present

## 2018-04-01 DIAGNOSIS — I493 Ventricular premature depolarization: Secondary | ICD-10-CM | POA: Diagnosis not present

## 2018-04-05 ENCOUNTER — Other Ambulatory Visit: Payer: Self-pay | Admitting: Cardiology

## 2018-04-25 DIAGNOSIS — M25511 Pain in right shoulder: Secondary | ICD-10-CM | POA: Diagnosis not present

## 2018-04-25 DIAGNOSIS — M25561 Pain in right knee: Secondary | ICD-10-CM | POA: Diagnosis not present

## 2018-05-04 DIAGNOSIS — M19011 Primary osteoarthritis, right shoulder: Secondary | ICD-10-CM | POA: Diagnosis not present

## 2018-06-27 DIAGNOSIS — K429 Umbilical hernia without obstruction or gangrene: Secondary | ICD-10-CM | POA: Diagnosis not present

## 2018-06-28 ENCOUNTER — Other Ambulatory Visit: Payer: Self-pay | Admitting: General Surgery

## 2018-06-29 ENCOUNTER — Telehealth: Payer: Self-pay | Admitting: *Deleted

## 2018-06-29 DIAGNOSIS — H1812 Bullous keratopathy, left eye: Secondary | ICD-10-CM | POA: Diagnosis not present

## 2018-06-29 NOTE — Telephone Encounter (Signed)
   [facility] Pre-operative Risk Assessment    Request for surgical clearance:   1. What type of surgery is being performed? UMBILICAL HERNIA REPAIR W/POSSIBLE MESH  2. When is this surgery scheduled? 07/18/18   3. What type of clearance is required (medical clearance vs. Pharmacy clearance to hold med vs. Both)? MEDICAL  4. Are there any medications that need to be held prior to surgery and how long? PT ON FLECAINIDE THOUGH NO ANTICOAGS LISTED    5. Practice name and name of physician performing surgery? [facility]; DR. Rodman Key [place]    6. What is your office phone number [phone]    7.   What is your office fax number              [phone]  8.   Anesthesia type (None, local, MAC, general) ? GENERAL   Logan Diaz 06/29/2018, 10:22 AM  _________________________________________________________________   (provider comments below)

## 2018-07-01 NOTE — Telephone Encounter (Signed)
   Primary Cardiologist: Jenkins Rouge, MD  Chart reviewed as part of pre-operative protocol coverage. Patient was contacted 07/01/2018 in reference to pre-operative risk assessment for pending surgery as outlined below.  TREBOR GALDAMEZ was last seen on 03/10/18 by Dr. Curt Bears  Since that day, [name] has done well w/o cardiac symptoms. No exertional CP, dyspnea and no palpitations.   Therefore, based on ACC/AHA guidelines, the patient would be at acceptable risk for the planned procedure without further cardiovascular testing.   I will route this recommendation to the requesting party via Epic fax function and remove from pre-op pool.  Please call with questions.  Lyda Jester, PA-C 07/01/2018, 2:00 PM

## 2018-07-05 NOTE — Pre-Procedure Instructions (Addendum)
Logan Diaz  07/05/2018      [id number] [place], [place] [address] [place] [address] Phone: [phone] Fax: [phone]    Your procedure is scheduled on July 18, 2017.  Report to [facility] Admitting at [place] AM.  Call this number if you have problems the morning of surgery:  [phone]   Remember:  Do not eat or drink after midnight.  You may drink clear liquids until 1030 AM .  Clear liquids allowed are:  Water, Juice (non-citric and without pulp), Clear Tea, Black Coffee only and Gatorade   Please complete your PRE-SURGERY ENSURE that was provided to you by 1030 AM.  Please, if able, drink it in one setting. DO NOT SIP.    Take these medicines the morning of surgery with A SIP OF WATER  Finasteride (proscar) Fluticasone (flonase) tamsulosin (flomax)  7 days prior to surgery STOP taking any Aspirin (unless otherwise instructed by your surgeon), Aleve, Naproxen, Ibuprofen, Motrin, Advil, Goody's, BC's, all herbal medications, fish oil, and all vitamins   Do not wear jewelry  Do not wear lotions, powders, or colgones, or deodorant.  Men may shave face and neck.  Do not bring valuables to the hospital.  [facility] is not responsible for any belongings or valuables.  Contacts, dentures or bridgework may not be worn into surgery.  Leave your suitcase in the car.  After surgery it may be brought to your room.  For patients admitted to the hospital, discharge time will be determined by your treatment team.  Patients discharged the day of surgery will not be allowed to drive home.    [facility]- Preparing For Surgery  Before surgery, you can play an important role. Because skin is not sterile, your skin needs to be as free of germs as possible. You can reduce the number of germs on your skin by washing with CHG (chlorahexidine gluconate) Soap before  surgery.  CHG is an antiseptic cleaner which kills germs and bonds with the skin to continue killing germs even after washing.    Oral Hygiene is also important to reduce your risk of infection.  Remember - BRUSH YOUR TEETH THE MORNING OF SURGERY WITH YOUR REGULAR TOOTHPASTE  Please do not use if you have an allergy to CHG or antibacterial soaps. If your skin becomes reddened/irritated stop using the CHG.  Do not shave (including legs and underarms) for at least 48 hours prior to first CHG shower. It is OK to shave your face.  Please follow these instructions carefully.   1. Shower the NIGHT BEFORE SURGERY and the MORNING OF SURGERY with CHG.   2. If you chose to wash your hair, wash your hair first as usual with your normal shampoo.  3. After you shampoo, rinse your hair and body thoroughly to remove the shampoo.  4. Use CHG as you would any other liquid soap. You can apply CHG directly to the skin and wash gently with a scrungie or a clean washcloth.   5. Apply the CHG Soap to your body ONLY FROM THE NECK DOWN.  Do not use on open wounds or open sores. Avoid contact with your eyes, ears, mouth and genitals (private parts). Wash Face and genitals (private parts)  with your normal soap.  6. Wash thoroughly, paying special attention to the area where your surgery will be performed.  7. Thoroughly rinse your  body with warm water from the neck down.  8. DO NOT shower/wash with your normal soap after using and rinsing off the CHG Soap.  9. Pat yourself dry with a CLEAN TOWEL.  10. Wear CLEAN PAJAMAS to bed the night before surgery, wear comfortable clothes the morning of surgery  11. Place CLEAN SHEETS on your bed the night of your first shower and DO NOT SLEEP WITH PETS.  Day of Surgery:  Do not apply any deodorants/lotions.  Please wear clean clothes to the hospital/surgery center.   Remember to brush your teeth WITH YOUR REGULAR TOOTHPASTE  Please read over the following fact  sheets that you were given.

## 2018-07-07 ENCOUNTER — Encounter (HOSPITAL_COMMUNITY)
Admission: RE | Admit: 2018-07-07 | Discharge: 2018-07-07 | Disposition: A | Discharge status: Home / Self Care | Payer: BLUE CROSS/BLUE SHIELD | Source: Ambulatory Visit | Attending: General Surgery | Admitting: General Surgery

## 2018-07-07 ENCOUNTER — Other Ambulatory Visit: Payer: Self-pay

## 2018-07-07 ENCOUNTER — Encounter (HOSPITAL_COMMUNITY): Payer: Self-pay

## 2018-07-07 DIAGNOSIS — Z01812 Encounter for preprocedural laboratory examination: Secondary | ICD-10-CM | POA: Insufficient documentation

## 2018-07-07 DIAGNOSIS — K429 Umbilical hernia without obstruction or gangrene: Secondary | ICD-10-CM | POA: Diagnosis not present

## 2018-07-07 LAB — BASIC METABOLIC PANEL
ANION GAP: 8 (ref 5–15)
BUN: 22 mg/dL — AB (ref 6–20)
CALCIUM: 9.2 mg/dL (ref 8.9–10.3)
CO2: 28 mmol/L (ref 22–32)
Chloride: 104 mmol/L (ref 98–111)
Creatinine, Ser: 1.16 mg/dL (ref 0.61–1.24)
GFR calc Af Amer: >60 mL/min (ref >60)
GFR calc non Af Amer: >60 mL/min (ref >60)
Glucose, Bld: 118 mg/dL — ABNORMAL HIGH (ref 70–99)
Potassium: 4.7 mmol/L (ref 3.5–5.1)
Sodium: 140 mmol/L (ref 135–145)

## 2018-07-07 LAB — CBC
HEMATOCRIT: 44.7 % (ref 39.0–52.0)
Hemoglobin: 14.5 g/dL (ref 13.0–17.0)
MCH: 30.9 pg (ref 26.0–34.0)
MCHC: 32.4 g/dL (ref 30.0–36.0)
MCV: 95.3 fL (ref 80.0–100.0)
NRBC: 0 % (ref 0.0–0.2)
Platelets: 259 10*3/uL (ref 150–400)
RBC: 4.69 MIL/uL (ref 4.22–5.81)
RDW: 14 % (ref 11.5–15.5)
WBC: 7.6 10*3/uL (ref 4.0–10.5)

## 2018-07-07 NOTE — Progress Notes (Signed)
PCP - Antony Contras, MD Cardiologist - Jenkins Rouge, MD  Chest x-ray - pt denies past year, no recent respiratory infections/complications EKG - 8/33/38 in EPIC  Stress Test -  12/10/17 in EPIC ECHO - 11/15/17 in EPIC  Cardiac Cath - pt denies ever  Sleep Study - pt denies CPAP - n/a  Fasting Blood Sugar - n/a Checks Blood Sugar _____ times a day-n/a  Blood Thinner Instructions: n/a Aspirin Instructions: n/a  Anesthesia review: yes, cardiac history  Patient denies shortness of breath, fever, cough and chest pain at PAT appointment  Patient verbalized understanding of instructions that were given to them at the PAT appointment. Patient was also instructed that they will need to review over the PAT instructions again at home before surgery.

## 2018-07-08 NOTE — Anesthesia Preprocedure Evaluation (Addendum)
Anesthesia Evaluation  Patient identified by MRN, date of birth, ID band Patient awake    Reviewed: Allergy & Precautions, NPO status , Patient's Chart, lab work & pertinent test results  History of Anesthesia Complications Negative for: history of anesthetic complications  Airway Mallampati: II  TM Distance: >3 FB Neck ROM: Full    Dental  (+) Teeth Intact   Pulmonary neg shortness of breath, neg COPD, neg recent URI, former smoker,    breath sounds clear to auscultation       Cardiovascular  Rhythm:Regular     Neuro/Psych negative neurological ROS  negative psych ROS   GI/Hepatic Neg liver ROS, UMBILICAL HERNIA   Endo/Other  Morbid obesity  Renal/GU negative Renal ROS     Musculoskeletal  (+) Arthritis ,   Abdominal   Peds  Hematology negative hematology ROS (+)   Anesthesia Other Findings 62 yo former smoker (quit 12/21/74) with h/o left eye blindness, HLD, BPH, PVC's who is scheduled for above surgery with Dr. Donne Hazel on 07/18/17.    Prior to last surgery 12/23/17 cardiac clearance required due PVCs noted on pre-op EKG.  He was seen by cardiologist, Dr. Johnsie Cancel, 11/12/17 who ordered TTE, 48 hour Holter monitor and Exercise myovue due to persistent PVCs.  No ischemia noted on stress test.  EF 55-60% on echo. Referred to electrophysiologist, Dr. Curt Bears due to PVCs, seen on 11/30/17. Pt started on flecainide vs undergoing an ablation.  Underwent left knee arthroscopy without complications on 3/84/66 with Dr. Theda Sers. He followed up with Dr. Curt Bears on 03/10/18.  Tolerating flecainide well, no episodes of palpitations noted by pt, no changes made at this visit.  Pt advised to follow up in 6 months with Dr. Curt Bears.   Cleared by cardiology for upcoming surgery per telephone encounter with Lyda Jester, PA-C on 07/01/18.  Per her note, "ALEXIS MIZUNO last seen on 8/29/19by Dr. Christinia Gully that day, Yehuda Budd done well w/o cardiac symptoms. No exertional CP, dyspnea and no palpitations.Therefore, based on ACC/AHA guidelines, the patient would be at acceptable risk for the planned procedure without further cardiovascular testing."   Reproductive/Obstetrics                           Anesthesia Physical Anesthesia Plan  ASA: II  Anesthesia Plan: General   Post-op Pain Management:    Induction: Intravenous  PONV Risk Score and Plan: 2 and Ondansetron and Dexamethasone  Airway Management Planned: Oral ETT  Additional Equipment: None  Intra-op Plan:   Post-operative Plan: Extubation in OR  Informed Consent: I have reviewed the patients History and Physical, chart, labs and discussed the procedure including the risks, benefits and alternatives for the proposed anesthesia with the patient or authorized representative who has indicated his/her understanding and acceptance.   Dental advisory given  Plan Discussed with: CRNA and Surgeon  Anesthesia Plan Comments: (See PAT note 07/07/18, Konrad Felix, PA-C)       Anesthesia Quick Evaluation

## 2018-07-08 NOTE — Progress Notes (Signed)
Anesthesia Chart Review    Case:  [id number] Date/Time:  07/18/18 1315   Procedure:  UMBILICAL HERNIA REPAIR WITH POSSIBLE MESH (N/A ) - GENERAL AND TAP BLOCK   Anesthesia type:  General   Pre-op diagnosis:  UMBILICAL HERNIA   Location:  [facility] OR ROOM 02 / [place] [facility]   Surgeon:  Rolm Bookbinder, MD      DISCUSSION: 62 yo former smoker (quit 12/21/74) with h/o left eye blindness, HLD, BPH, PVC's who is scheduled for above surgery with Dr. Donne Hazel on 07/18/17.    Prior to last surgery 12/23/17 cardiac clearance required due PVCs noted on pre-op EKG.  He was seen by cardiologist, Dr. Johnsie Cancel, 11/12/17 who ordered TTE, 48 hour Holter monitor and Exercise myovue due to persistent PVCs.  No ischemia noted on stress test.  EF 55-60% on echo. Referred to electrophysiologist, Dr. Curt Bears due to PVCs, seen on 11/30/17. Pt started on flecainide vs undergoing an ablation.  Underwent left knee arthroscopy without complications on 10/27/58 with Dr. Theda Sers. He followed up with Dr. Curt Bears on 03/10/18.  Tolerating flecainide well, no episodes of palpitations noted by pt, no changes made at this visit.  Pt advised to follow up in 6 months with Dr. Curt Bears.   Cleared by cardiology for upcoming surgery per telephone encounter with Lyda Jester, PA-C on 07/01/18.  Per her note, "Logan Diaz was last seen on 03/10/18 by Dr. Curt Bears  Since that day, [name] has done well w/o cardiac symptoms. No exertional CP, dyspnea and no palpitations. Therefore, based on ACC/AHA guidelines, the patient would be at acceptable risk for the planned procedure without further cardiovascular testing."   Pt can proceed with planned procedure barring acute status changes.   VS: BP (!) 150/85   Pulse 80   Temp 37.1 C (Oral)   Resp 18   Ht 6\' 1"  (1.854 m)   Wt 107.8 kg   SpO2 99%   BMI 31.35 kg/m   PROVIDERS: Antony Contras, MD is PCP   Constance Haw, MD is Electrophysiologist  Jenkins Rouge, MD is Cardiologist    LABS: Labs reviewed: Acceptable for surgery. (all labs ordered are listed, but only abnormal results are displayed)  Labs Reviewed  BASIC METABOLIC PANEL - Abnormal; Notable for the following components:      Result Value   Glucose, Bld 118 (*)    BUN 22 (*)    All other components within normal limits  CBC     IMAGES:   EKG 03/10/18:  Rate 64 Normal sinus rhythm Nonspecific intraventricular conduction delay Borderline EKG  CV:  Echo 11/15/17:  Study Conclusions  - Left ventricle: The cavity size was normal. Wall thickness was   increased in a pattern of mild LVH. Systolic function was normal.   The estimated ejection fraction was in the range of 55% to 60%.   Wall motion was normal; there were no regional wall motion   abnormalities. Left ventricular diastolic function parameters   were normal. - Mitral valve: There was mild regurgitation. - Atrial septum: No defect or patent foramen ovale was identified.  SPECT 11/15/17:    Blood pressure demonstrated a normal response to exercise.  There was no ST segment deviation noted during stress.  Defect 1: There is a medium defect of moderate severity present in the basal inferoseptal, mid inferoseptal and apex location.  This is a low risk study.   Abnormal, low risk stress nuclear study with probable inferoseptal and apical thinning but  no significant ischemia; study not gated due to ectopy; suggest echo to assess LV function.    48 Holter Monitor 11/15/17 NSR PVC;s 26% of beats couplets occasional NSVT only 3 beats  PAC;s  Not a candidate for beta blocker due to resting bradycardia F/u with EP to consider AAT to suppress  ETT 12/10/17 - personally reviewed  Blood pressure demonstrated a hypertensive response to exercise.  There was no ST segment deviation noted during stress.  1. Normal exercise tolerance.  2. No evidence for ischemia by ST segment analysis.   Past Medical History:  Diagnosis Date  .  Blindness of left eye    SECONDARY TO A MAGNOLIA TREE PRICKLY BUD ACCIDENT AT AGE 60  . BPH (benign prostatic hyperplasia)   . Bradycardia   . ED (erectile dysfunction)   . History of exercise intolerance dr Curt Bears   12-10-2017  normal exercise tolerence,  no evidence ishemia by ST segment analysis  . Hyperlipidemia   . Irregular heart beat   . OA (osteoarthritis)    LEFT KNEE  . PAC (premature atrial contraction)   . Prediabetes   . PVCs (premature ventricular contractions)    cardiology EP -- dr Curt Bears--  holter monitor w/  PVCs 26% beats couplets occasional NSVT only 3 beats  . Seasonal allergies   . Wears glasses     Past Surgical History:  Procedure Laterality Date  . ANTERIOR CERVICAL DECOMP/DISCECTOMY FUSION  07/2017   C6-7  . CARDIOVASCULAR STRESS TEST  11-15-2017   dr Johnsie Cancel   Low risk nuclear study w/ probable inferoseptal and apical thinning but no significant ischemia;  study no gated due to ectopy  . EYE SURGERY Left x2 @ age 71   injury--  and  Catarat sx @ age 82 and 71 approx.  [facility] KNEE ARTHROSCOPY Left 1980s  . KNEE ARTHROSCOPY Left 12/23/2017   Procedure: Left knee arthroscopy, debridement removal loose bodies, chondroplasty and partial lateral meniscectomy;  Surgeon: Sydnee Cabal, MD;  Location: [facility];  Service: Orthopedics;  Laterality: Left;  45 mins / to stay over night monitor floor cardiology per [facility]  . [place] SURGERY  1990s  . TRANSTHORACIC ECHOCARDIOGRAM  11-15-2017   dr Johnsie Cancel   mild LVH, ef 55-60%/  mild MR, PR and TR    MEDICATIONS: . cephALEXin (KEFLEX) 500 MG capsule  . finasteride (PROSCAR) 5 MG tablet  . flecainide (TAMBOCOR) 100 MG tablet  . fluticasone (FLONASE) 50 MCG/ACT nasal spray  . Glucos-Chond-Hyal Ac-Ca Fructo (MOVE FREE JOINT HEALTH ADVANCE PO)  . ibuprofen (ADVIL,MOTRIN) 200 MG tablet  . MAGNESIUM PO  . Omega-3 Fatty Acids (FISH OIL PO)  . sildenafil (REVATIO) 20 MG tablet  . tamsulosin  (FLOMAX) 0.4 MG CAPS capsule   No current facility-administered medications for this encounter.      Maia Plan [facility] Pre-Surgical Testing [phone] 07/08/18 11:30 AM

## 2018-07-18 ENCOUNTER — Ambulatory Visit (HOSPITAL_COMMUNITY): Payer: BLUE CROSS/BLUE SHIELD | Admitting: Certified Registered Nurse Anesthetist

## 2018-07-18 ENCOUNTER — Encounter (HOSPITAL_COMMUNITY)
Admission: RE | Disposition: A | Discharge status: Home / Self Care | Payer: Self-pay | Source: Home / Self Care | Attending: General Surgery

## 2018-07-18 ENCOUNTER — Encounter (HOSPITAL_COMMUNITY): Payer: Self-pay

## 2018-07-18 ENCOUNTER — Other Ambulatory Visit: Payer: Self-pay

## 2018-07-18 ENCOUNTER — Ambulatory Visit (HOSPITAL_COMMUNITY): Payer: BLUE CROSS/BLUE SHIELD | Admitting: Physician Assistant

## 2018-07-18 ENCOUNTER — Ambulatory Visit (HOSPITAL_COMMUNITY)
Admission: RE | Admit: 2018-07-18 | Discharge: 2018-07-18 | Disposition: A | Discharge status: Home / Self Care | Payer: BLUE CROSS/BLUE SHIELD | Attending: General Surgery | Admitting: General Surgery

## 2018-07-18 DIAGNOSIS — K429 Umbilical hernia without obstruction or gangrene: Secondary | ICD-10-CM | POA: Insufficient documentation

## 2018-07-18 DIAGNOSIS — Z6831 Body mass index (BMI) 31.0-31.9, adult: Secondary | ICD-10-CM | POA: Insufficient documentation

## 2018-07-18 DIAGNOSIS — Z87891 Personal history of nicotine dependence: Secondary | ICD-10-CM | POA: Diagnosis not present

## 2018-07-18 DIAGNOSIS — Z79899 Other long term (current) drug therapy: Secondary | ICD-10-CM | POA: Insufficient documentation

## 2018-07-18 HISTORY — DX: Other complications of anesthesia, initial encounter: T88.59XA

## 2018-07-18 HISTORY — PX: UMBILICAL HERNIA REPAIR: SHX196

## 2018-07-18 HISTORY — DX: Umbilical hernia without obstruction or gangrene: K42.9

## 2018-07-18 HISTORY — DX: Adverse effect of unspecified anesthetic, initial encounter: T41.45XA

## 2018-07-18 SURGERY — REPAIR, HERNIA, UMBILICAL, ADULT
Anesthesia: General | Site: Abdomen

## 2018-07-18 MED ORDER — BUPIVACAINE-EPINEPHRINE 0.25% -1:200000 IJ SOLN
INTRAMUSCULAR | Status: DC | PRN
  Administered 2018-07-18: 10 mL

## 2018-07-18 MED ORDER — KETOROLAC TROMETHAMINE 15 MG/ML IJ SOLN
INTRAMUSCULAR | Status: AC
  Administered 2018-07-18: 15 mg via INTRAVENOUS
  Filled 2018-07-18: qty 1

## 2018-07-18 MED ORDER — BACITRACIN ZINC 500 UNIT/GM EX OINT
TOPICAL_OINTMENT | CUTANEOUS | Status: AC
  Filled 2018-07-18: qty 28.35

## 2018-07-18 MED ORDER — OXYCODONE HCL 5 MG PO TABS: 5.0000 mg | ORAL_TABLET | Freq: Once | ORAL | Status: DC | PRN

## 2018-07-18 MED ORDER — ACETAMINOPHEN 160 MG/5ML PO SOLN: 1000.0000 mg | Freq: Once | ORAL | Status: DC | PRN

## 2018-07-18 MED ORDER — FENTANYL CITRATE (PF) 100 MCG/2ML IJ SOLN
INTRAMUSCULAR | Status: AC
  Filled 2018-07-18: qty 2

## 2018-07-18 MED ORDER — GABAPENTIN 100 MG PO CAPS
ORAL_CAPSULE | ORAL | Status: AC
  Administered 2018-07-18: 100 mg via ORAL
  Filled 2018-07-18: qty 1

## 2018-07-18 MED ORDER — KETOROLAC TROMETHAMINE 15 MG/ML IJ SOLN
15.0000 mg | INTRAMUSCULAR | Status: AC
  Administered 2018-07-18: 15 mg via INTRAVENOUS

## 2018-07-18 MED ORDER — LIDOCAINE HCL (CARDIAC) PF 100 MG/5ML IV SOSY
PREFILLED_SYRINGE | INTRAVENOUS | Status: DC | PRN
  Administered 2018-07-18: 40 mg via INTRAVENOUS

## 2018-07-18 MED ORDER — PROPOFOL 10 MG/ML IV BOLUS
INTRAVENOUS | Status: AC
  Filled 2018-07-18: qty 20

## 2018-07-18 MED ORDER — TRAMADOL HCL 50 MG PO TABS
50.0000 mg | ORAL_TABLET | Freq: Once | ORAL | Status: AC
  Administered 2018-07-18: 50 mg via ORAL

## 2018-07-18 MED ORDER — TRAMADOL HCL 50 MG PO TABS: 100.0000 mg | ORAL_TABLET | Freq: Four times a day (QID) | ORAL | 0 refills | Status: DC | PRN

## 2018-07-18 MED ORDER — FENTANYL CITRATE (PF) 250 MCG/5ML IJ SOLN
INTRAMUSCULAR | Status: DC | PRN
  Administered 2018-07-18 (×2): 50 ug via INTRAVENOUS

## 2018-07-18 MED ORDER — ACETAMINOPHEN 500 MG PO TABS: 1000.0000 mg | ORAL_TABLET | Freq: Once | ORAL | Status: DC | PRN

## 2018-07-18 MED ORDER — MIDAZOLAM HCL 2 MG/2ML IJ SOLN
INTRAMUSCULAR | Status: AC
  Filled 2018-07-18: qty 2

## 2018-07-18 MED ORDER — MIDAZOLAM HCL 2 MG/2ML IJ SOLN
INTRAMUSCULAR | Status: DC | PRN
  Administered 2018-07-18: 2 mg via INTRAVENOUS

## 2018-07-18 MED ORDER — DEXAMETHASONE SODIUM PHOSPHATE 10 MG/ML IJ SOLN
INTRAMUSCULAR | Status: DC | PRN
  Administered 2018-07-18: 10 mg via INTRAVENOUS

## 2018-07-18 MED ORDER — GABAPENTIN 100 MG PO CAPS
100.0000 mg | ORAL_CAPSULE | ORAL | Status: AC
  Administered 2018-07-18: 100 mg via ORAL

## 2018-07-18 MED ORDER — PHENYLEPHRINE 40 MCG/ML (10ML) SYRINGE FOR IV PUSH (FOR BLOOD PRESSURE SUPPORT)
PREFILLED_SYRINGE | INTRAVENOUS | Status: AC
  Filled 2018-07-18: qty 50

## 2018-07-18 MED ORDER — DEXAMETHASONE SODIUM PHOSPHATE 10 MG/ML IJ SOLN
INTRAMUSCULAR | Status: AC
  Filled 2018-07-18: qty 1

## 2018-07-18 MED ORDER — CEFAZOLIN SODIUM-DEXTROSE 2-4 GM/100ML-% IV SOLN
2.0000 g | INTRAVENOUS | Status: AC
  Administered 2018-07-18: 2 g via INTRAVENOUS

## 2018-07-18 MED ORDER — ACETAMINOPHEN 500 MG PO TABS
1000.0000 mg | ORAL_TABLET | ORAL | Status: AC
  Administered 2018-07-18: 1000 mg via ORAL

## 2018-07-18 MED ORDER — FENTANYL CITRATE (PF) 250 MCG/5ML IJ SOLN
INTRAMUSCULAR | Status: AC
  Filled 2018-07-18: qty 5

## 2018-07-18 MED ORDER — CEFAZOLIN SODIUM-DEXTROSE 2-4 GM/100ML-% IV SOLN
INTRAVENOUS | Status: AC
  Filled 2018-07-18: qty 100

## 2018-07-18 MED ORDER — ACETAMINOPHEN 500 MG PO TABS
ORAL_TABLET | ORAL | Status: AC
  Administered 2018-07-18: 1000 mg via ORAL
  Filled 2018-07-18: qty 2

## 2018-07-18 MED ORDER — PROPOFOL 10 MG/ML IV BOLUS
INTRAVENOUS | Status: DC | PRN
  Administered 2018-07-18: 180 mg via INTRAVENOUS
  Administered 2018-07-18: 20 mg via INTRAVENOUS
  Administered 2018-07-18: 50 mg via INTRAVENOUS

## 2018-07-18 MED ORDER — ENSURE PRE-SURGERY PO LIQD: 296.0000 mL | Freq: Once | ORAL | Status: DC

## 2018-07-18 MED ORDER — ACETAMINOPHEN 10 MG/ML IV SOLN: 1000.0000 mg | Freq: Once | INTRAVENOUS | Status: DC | PRN

## 2018-07-18 MED ORDER — ONDANSETRON HCL 4 MG/2ML IJ SOLN
INTRAMUSCULAR | Status: AC
  Filled 2018-07-18: qty 2

## 2018-07-18 MED ORDER — ONDANSETRON HCL 4 MG/2ML IJ SOLN
INTRAMUSCULAR | Status: DC | PRN
  Administered 2018-07-18: 4 mg via INTRAVENOUS

## 2018-07-18 MED ORDER — FENTANYL CITRATE (PF) 100 MCG/2ML IJ SOLN: 25.0000 ug | INTRAMUSCULAR | Status: DC | PRN

## 2018-07-18 MED ORDER — EPHEDRINE SULFATE 50 MG/ML IJ SOLN
INTRAMUSCULAR | Status: DC | PRN
  Administered 2018-07-18: 10 mg via INTRAVENOUS
  Administered 2018-07-18: 5 mg via INTRAVENOUS

## 2018-07-18 MED ORDER — BACITRACIN 500 UNIT/GM EX OINT
TOPICAL_OINTMENT | CUTANEOUS | Status: DC | PRN
  Administered 2018-07-18: 1 via TOPICAL

## 2018-07-18 MED ORDER — OXYCODONE HCL 5 MG/5ML PO SOLN: 5.0000 mg | Freq: Once | ORAL | Status: DC | PRN

## 2018-07-18 MED ORDER — TRAMADOL HCL 50 MG PO TABS
ORAL_TABLET | ORAL | Status: AC
  Filled 2018-07-18: qty 1

## 2018-07-18 MED ORDER — LACTATED RINGERS IV SOLN
INTRAVENOUS | Status: DC
  Administered 2018-07-18: 12:00:00 via INTRAVENOUS

## 2018-07-18 MED ORDER — PHENYLEPHRINE HCL 10 MG/ML IJ SOLN
INTRAMUSCULAR | Status: DC | PRN
  Administered 2018-07-18: 120 ug via INTRAVENOUS

## 2018-07-18 MED ORDER — LIDOCAINE 2% (20 MG/ML) 5 ML SYRINGE
INTRAMUSCULAR | Status: AC
  Filled 2018-07-18: qty 5

## 2018-07-18 MED ORDER — 0.9 % SODIUM CHLORIDE (POUR BTL) OPTIME
TOPICAL | Status: DC | PRN
  Administered 2018-07-18: 1000 mL

## 2018-07-18 MED ORDER — BUPIVACAINE-EPINEPHRINE (PF) 0.25% -1:200000 IJ SOLN
INTRAMUSCULAR | Status: AC
  Filled 2018-07-18: qty 30

## 2018-07-18 SURGICAL SUPPLY — 45 items
BLADE CLIPPER SURG (BLADE) ×1 IMPLANT
BLADE SURG 15 STRL LF DISP TIS (BLADE) ×1 IMPLANT
BLADE SURG 15 STRL SS (BLADE) ×2
CANISTER SUCT 3000ML PPV (MISCELLANEOUS) IMPLANT
CHLORAPREP W/TINT 26ML (MISCELLANEOUS) ×2 IMPLANT
CLSR STERI-STRIP ANTIMIC 1/2X4 (GAUZE/BANDAGES/DRESSINGS) ×1 IMPLANT
COVER SURGICAL LIGHT HANDLE (MISCELLANEOUS) ×2 IMPLANT
COVER WAND RF STERILE (DRAPES) ×2 IMPLANT
DECANTER SPIKE VIAL GLASS SM (MISCELLANEOUS) ×2 IMPLANT
DERMABOND ADVANCED (GAUZE/BANDAGES/DRESSINGS) ×1
DERMABOND ADVANCED .7 DNX12 (GAUZE/BANDAGES/DRESSINGS) ×1 IMPLANT
DRAPE LAPAROSCOPIC ABDOMINAL (DRAPES) ×2 IMPLANT
DRAPE UTILITY XL STRL (DRAPES) ×2 IMPLANT
ELECT CAUTERY BLADE 6.4 (BLADE) ×2 IMPLANT
ELECT REM PT RETURN 9FT ADLT (ELECTROSURGICAL) ×2
ELECTRODE REM PT RTRN 9FT ADLT (ELECTROSURGICAL) ×1 IMPLANT
GAUZE 4X4 16PLY RFD (DISPOSABLE) ×2 IMPLANT
GLOVE BIO SURGEON STRL SZ7 (GLOVE) ×2 IMPLANT
GLOVE BIOGEL PI IND STRL 7.5 (GLOVE) ×1 IMPLANT
GLOVE BIOGEL PI INDICATOR 7.5 (GLOVE) ×1
GOWN STRL REUS W/ TWL LRG LVL3 (GOWN DISPOSABLE) ×2 IMPLANT
GOWN STRL REUS W/TWL LRG LVL3 (GOWN DISPOSABLE) ×4
KIT BASIN OR (CUSTOM PROCEDURE TRAY) ×2 IMPLANT
KIT TURNOVER KIT B (KITS) ×2 IMPLANT
NDL HYPO 25GX1X1/2 BEV (NEEDLE) ×1 IMPLANT
NEEDLE HYPO 25GX1X1/2 BEV (NEEDLE) ×2 IMPLANT
NS IRRIG 1000ML POUR BTL (IV SOLUTION) ×2 IMPLANT
PACK SURGICAL SETUP 50X90 (CUSTOM PROCEDURE TRAY) ×2 IMPLANT
PAD ARMBOARD 7.5X6 YLW CONV (MISCELLANEOUS) ×2 IMPLANT
PENCIL BUTTON HOLSTER BLD 10FT (ELECTRODE) ×2 IMPLANT
STRIP CLOSURE SKIN 1/2X4 (GAUZE/BANDAGES/DRESSINGS) ×1 IMPLANT
SUT ETHIBOND 0 MO6 C/R (SUTURE) IMPLANT
SUT MNCRL AB 4-0 PS2 18 (SUTURE) ×2 IMPLANT
SUT NOVA NAB DX-16 0-1 5-0 T12 (SUTURE) ×2 IMPLANT
SUT PROLENE 0 CT 1 CR/8 (SUTURE) IMPLANT
SUT VIC AB 2-0 CT1 27 (SUTURE) ×2
SUT VIC AB 2-0 CT1 TAPERPNT 27 (SUTURE) ×1 IMPLANT
SUT VIC AB 3-0 SH 27 (SUTURE) ×2
SUT VIC AB 3-0 SH 27X BRD (SUTURE) ×1 IMPLANT
SUT VICRYL AB 2 0 TIES (SUTURE) ×2 IMPLANT
SYR CONTROL 10ML LL (SYRINGE) ×2 IMPLANT
TOWEL OR 17X24 6PK STRL BLUE (TOWEL DISPOSABLE) ×2 IMPLANT
TOWEL OR 17X26 10 PK STRL BLUE (TOWEL DISPOSABLE) ×2 IMPLANT
TUBE CONNECTING 12X1/4 (SUCTIONS) IMPLANT
YANKAUER SUCT BULB TIP NO VENT (SUCTIONS) IMPLANT

## 2018-07-18 NOTE — Transfer of Care (Signed)
Immediate Anesthesia Transfer of Care Note  Patient: Logan Diaz  Procedure(s) Performed: UMBILICAL HERNIA REPAIR (N/A Abdomen)  Patient Location: PACU  Anesthesia Type:General  Level of Consciousness: awake, alert  and oriented  Airway & Oxygen Therapy: Patient Spontanous Breathing and Patient connected to nasal cannula oxygen  Post-op Assessment: Report given to RN and Post -op Vital signs reviewed and stable  Post vital signs: Reviewed  Last Vitals:  Vitals Value Taken Time  BP    Temp    Pulse    Resp    SpO2      Last Pain:  Vitals:   07/18/18 1215  TempSrc:   PainSc: 0-No pain         Complications: No apparent anesthesia complications

## 2018-07-18 NOTE — Interval H&P Note (Signed)
History and Physical Interval Note:  07/18/2018 12:44 PM  Logan Diaz  has presented today for surgery, with the diagnosis of UMBILICAL HERNIA  The various methods of treatment have been discussed with the patient and family. After consideration of risks, benefits and other options for treatment, the patient has consented to  Procedure(s) with comments: UMBILICAL HERNIA REPAIR WITH POSSIBLE MESH (N/A) - GENERAL AND TAP BLOCK as a surgical intervention .  The patient's history has been reviewed, patient examined, no change in status, stable for surgery.  I have reviewed the patient's chart and labs.  Questions were answered to the patient's satisfaction.     Rolm Bookbinder

## 2018-07-18 NOTE — H&P (Signed)
70 yom who I saw years ago for [facility]. this hadnt bothered him until more recently. he notes a bulge present. he notes some discomfort and he reduces it at times while working. he works Architect. 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 no n/v. has csc 10 years ago. he is on flecainide and sees Dr Johnsie Cancel as his cardiologist. he would like to consider repair now due to symptoms  Past Surgical History Emeline Gins, [facility]; 06/27/2018 3:05 PM) Cataract Surgery  Left. Knee Surgery  Left. Spinal Surgery - Lower Back  Spinal Surgery - Neck   Diagnostic Studies History Emeline Gins, [place]; 06/27/2018 3:05 PM) Colonoscopy  5-10 years ago  Allergies Emeline Gins, CMA; 06/27/2018 3:06 PM) No Known Drug Allergies [06/27/2018]: Allergies Reconciled   Medication History Emeline Gins, CMA; 06/27/2018 3:06 PM) Finasteride (5MG  Tablet, Oral) Active. Flecainide Acetate (100MG  Tablet, Oral) Active. Tamsulosin HCl (0.4MG  Capsule, Oral) Active. Medications Reconciled  Social History Emeline Gins, [place]; 06/27/2018 3:05 PM) Alcohol use  Remotely quit alcohol use. Caffeine use  Carbonated beverages, Coffee, Tea. No drug use  Tobacco use  Former smoker.  Family History Emeline Gins, [place]; 06/27/2018 3:05 PM) Cancer  Mother.  Other Problems Emeline Gins, CMA; 06/27/2018 3:05 PM) Back Pain  Bladder Problems  Inguinal Hernia  Migraine Headache  Ventral Hernia Repair   Review of Systems Emeline Gins CMA; 06/27/2018 3:05 PM) General Not Present- Appetite Loss, Chills, Fatigue, Fever, Night Sweats, Weight Gain and Weight Loss. Skin Not Present- Change in Wart/Mole, Dryness, Hives, Jaundice, New Lesions, Non-Healing Wounds, Rash and Ulcer. HEENT Present- Ringing in the Ears, Seasonal Allergies and Wears glasses/contact lenses. Not Present- Earache, Hearing Loss, Hoarseness, Nose Bleed, Oral Ulcers, Sinus Pain, Sore Throat, Visual Disturbances and Yellow Eyes. Respiratory  Present- Snoring. Not Present- Bloody sputum, Chronic Cough, Difficulty Breathing and Wheezing. Breast Not Present- Breast Mass, Breast Pain, Nipple Discharge and Skin Changes. Cardiovascular Not Present- Chest Pain, Difficulty Breathing Lying Down, Leg Cramps, Palpitations, Rapid Heart Rate, Shortness of Breath and Swelling of Extremities. Gastrointestinal Not Present- Abdominal Pain, Bloating, Bloody Stool, Change in Bowel Habits, Chronic diarrhea, Constipation, Difficulty Swallowing, Excessive gas, Gets full quickly at meals, Hemorrhoids, Indigestion, Nausea, Rectal Pain and Vomiting. Male Genitourinary Not Present- Blood in Urine, Change in Urinary Stream, Frequency, Impotence, Nocturia, Painful Urination, Urgency and Urine Leakage. Musculoskeletal Present- Back Pain, Joint Pain, Joint Stiffness, Muscle Pain and Swelling of Extremities. Not Present- Muscle Weakness. Neurological Not Present- Decreased Memory, Fainting, Headaches, Numbness, Seizures, Tingling, Tremor, Trouble walking and Weakness. Psychiatric Not Present- Anxiety, Bipolar, Change in Sleep Pattern, Depression, Fearful and Frequent crying. Endocrine Not Present- Cold Intolerance, Excessive Hunger, Hair Changes, Heat Intolerance, Hot flashes and New Diabetes. Hematology Not Present- Blood Thinners, Easy Bruising, Excessive bleeding, Gland problems, HIV and Persistent Infections.  Vitals Emeline Gins CMA; 06/27/2018 3:06 PM) 06/27/2018 3:06 PM Weight: 232 lb Height: 73in Body Surface Area: 2.29 m Body Mass Index: 30.61 kg/m  Temp.: 98.75F  Pulse: 80 (Regular)  BP: 148/80 (Sitting, Left Arm, Standard) Physical Exam Rolm Bookbinder MD; 06/27/2018 3:55 PM) General Mental Status-Alert. Chest and Lung Exam Chest and lung exam reveals -quiet, even and easy respiratory effort with no use of accessory muscles. Cardiovascular Cardiovascular examination reveals -normal heart sounds, regular rate and rhythm with  no murmurs. Abdomen Note: soft nt/nd 1 cm reducible mildly tender uh Neurologic Neurologic evaluation reveals -alert and oriented x 3 with no impairment of recent or remote memory.  Assessment & Plan Rolm Bookbinder MD; 43/[id number]/9518 8:41 PM) UMBILICAL HERNIA (  L16.4) Story: Umbilical hernia repair, possibly with mesh This is small reducible hernia that has become symptomatic. I think reasonable to repair as he is reducing it at work for symptoms. I discussed open repair possibly with mesh depending on size. we discussed recovery postop and limitations. we also discussed risk of surgery as well including recurrence.

## 2018-07-18 NOTE — Anesthesia Procedure Notes (Signed)
Procedure Name: LMA Insertion Date/Time: 07/18/2018 1:21 PM Performed by: Oleta Mouse, MD Pre-anesthesia Checklist: Patient identified, Emergency Drugs available, Suction available, Patient being monitored and Timeout performed Patient Re-evaluated:Patient Re-evaluated prior to induction Oxygen Delivery Method: Circle system utilized Preoxygenation: Pre-oxygenation with 100% oxygen Induction Type: IV induction LMA: LMA inserted LMA Size: 4.0 Number of attempts: 1

## 2018-07-18 NOTE — Op Note (Signed)
Preoperative diagnosis: umbilical hernia Postoperative diagnosis: same as above Procedure: Primary umbilical hernia repair Surgeon: Dr Serita Grammes  Anesthesia: general EBL: minimal Drains none Complications none Specimens none Sponge and needle count correct dispo to recovery stable  Indications: This is a 63 year old male with a symptomatic umbilical hernia.  We discussed what would likely be a primary repair given the size on his exam.  Risk and benefits were discussed prior to beginning.  Procedure: After informed consent was obtained the patient was taken to the operating room.  He was given antibiotics.  SCDs were in place.  He was placed under general anesthesia without complication.  He was prepped and draped in a standard sterile surgical fashion.  A surgical timeout was then performed.  I then infiltrated Marcaine below the umbilicus.  I made a curvilinear incision.  I dissected down the umbilical stalk.  This was divided.  He was noted to have 2 small hernias right next to each other.  The first 1 was about 2 mm in size and I repaired this with a #1 Novafil suture.  The larger one which was more symptomatic was just above this.  I really incorporated the other one into this repair as well.  I removed some of the incarcerated fat.  I then cleaned off the edges and repaired this with #1 Novafil suture.  The total defect size was less than 1-1/2 cm.  This was done without any tension.  There was no evidence of any additional hernia.  The base of his umbilicus appeared to have some sort of chronic infection so I did just remove this and closed this with 3-0 Vicryl.  I want to do this just to clean this area.  I then tacked the umbilicus down with 3-0 Vicryl.  The dermis was closed with 3-0 Vicryl.  The skin was closed with 4-0 Monocryl.  Glue and Steri-Strips were placed.  He tolerated this well was transferred to recovery stable.

## 2018-07-18 NOTE — Discharge Instructions (Signed)
[facility]  UMBILICAL OR INGUINAL HERNIA REPAIR: POST OP INSTRUCTIONS Take 400 mg ibuprofen every 8 hours or 650 mg tylenol every 6 hours for next 72 hours.  Use ice 3-4 times daily.  Always review your discharge instruction sheet given to you by the facility where your surgery was performed. IF YOU HAVE DISABILITY OR FAMILY LEAVE FORMS, YOU MUST BRING THEM TO THE OFFICE FOR PROCESSING.   DO NOT GIVE THEM TO YOUR DOCTOR.  1. A  prescription for pain medication may be given to you upon discharge.  Take your pain medication as prescribed, if needed.  If narcotic pain medicine is not needed, then you may take acetaminophen (Tylenol), naprosyn (Alleve) or ibuprofen (Advil) as needed. 2. Take your usually prescribed medications unless otherwise directed. 3. If you need a refill on your pain medication, please contact your pharmacy.  They will contact our office to request authorization. Prescriptions will not be filled after 5 pm or on week-ends. 4. You should follow a light diet the first 24 hours after arrival home, such as soup and crackers, etc.  Be sure to include lots of fluids daily.  Resume your normal diet the day after surgery. 5. Most patients will experience some swelling and bruising around the umbilicus or in the groin and scrotum.  Ice packs and reclining will help.  Swelling and bruising can take several days to resolve.  6. It is common to experience some constipation if taking pain medication after surgery.  Increasing fluid intake and taking a stool softener (such as Colace) will usually help or prevent this problem from occurring.  A mild laxative (Milk of Magnesia or Miralax) should be taken according to package directions if there are no bowel movements after 48 hours. 7. Unless discharge instructions indicate otherwise, you may remove your bandages 48 hours after surgery, and you may shower at that time.  You may have steri-strips (small skin tapes) in place  directly over the incision.  These strips should be left on the skin for 7-10 days and will come off on their own.  If your surgeon used skin glue on the incision, you may shower in 24 hours.  The glue will flake off over the next 2-3 weeks.  Any sutures or staples will be removed at the office during your follow-up visit. 8. ACTIVITIES:  You may resume regular (light) daily activities beginning the next day--such as daily self-care, walking, climbing stairs--gradually increasing activities as tolerated.  You may have sexual intercourse when it is comfortable.  Refrain from any heavy lifting or straining until approved by your doctor. a. You may drive when you are no longer taking prescription pain medication, you can comfortably wear a seatbelt, and you can safely maneuver your car and apply brakes. b. RETURN TO WORK:  __________________________________________________________ 9. You should see your doctor in the office for a follow-up appointment approximately 2-3 weeks after your surgery.  Make sure that you call for this appointment within a day or two after you arrive home to insure a convenient appointment time. 10. OTHER INSTRUCTIONS:  __________________________________________________________________________________________________________________________________________________________________________________________  WHEN TO CALL YOUR DOCTOR: 1. Fever over 101.0 2. Inability to urinate 3. Nausea and/or vomiting 4. Extreme swelling or bruising 5. Continued bleeding from incision. 6. Increased pain, redness, or drainage from the incision  The clinic staff is available to answer your questions during regular business hours.  Please dont hesitate to call and ask to speak to one of the nurses for clinical  concerns.  If you have a medical emergency, go to the nearest emergency room or call 911.  A surgeon from [facility] is always on call at the hospital   [address] [place], [place], [place]  [address] ?  [address], [place], [place]   [address] [phone] ? [phone] ? FAX [phone] Web site: [email or web address]

## 2018-07-19 ENCOUNTER — Encounter (HOSPITAL_COMMUNITY): Payer: Self-pay | Admitting: General Surgery

## 2018-07-19 NOTE — Anesthesia Postprocedure Evaluation (Signed)
Anesthesia Post Note  Patient: Logan Diaz  Procedure(s) Performed: UMBILICAL HERNIA REPAIR (N/A Abdomen)     Patient location during evaluation: PACU Anesthesia Type: General Level of consciousness: awake and alert Pain management: pain level controlled Vital Signs Assessment: post-procedure vital signs reviewed and stable Respiratory status: spontaneous breathing, nonlabored ventilation, respiratory function stable and patient connected to nasal cannula oxygen Cardiovascular status: blood pressure returned to baseline and stable Postop Assessment: no apparent nausea or vomiting Anesthetic complications: no    Last Vitals:  Vitals:   07/18/18 1450 07/18/18 1458  BP: 138/83 (!) 145/80  Pulse: 72 70  Resp: 17 17  Temp:  (!) 36.3 C  SpO2: 100% 100%    Last Pain:  Vitals:   07/18/18 1458  TempSrc:   PainSc: 3                  Latif Nazareno

## 2018-08-01 IMAGING — MR MR CERVICAL SPINE W/O CM
4 of 5 series · 27 of 48 positions shown · non-contrast
Comparison: 06/21/2016

CLINICAL DATA: Cervical radiculopathy. Intermittent left arm pain
and numbness.

EXAM:
MRI CERVICAL SPINE WITHOUT CONTRAST
TECHNIQUE: Multiplanar, multisequence MR imaging of the cervical spine was
performed. No intravenous contrast was administered.

[Series 5: T1 · sagittal · 3.0mm · 0.66mm/px · 6 of 15 slices shown]
[im 1/15]
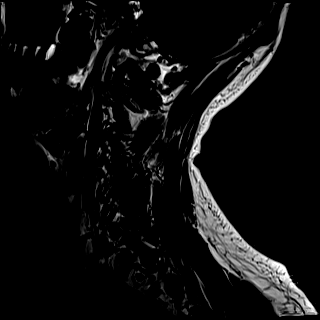
[im 3/15]
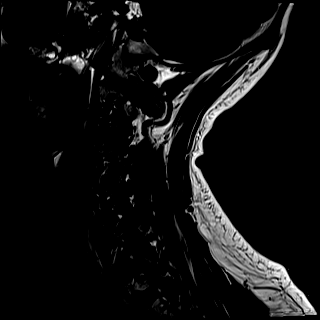
[im 6/15]
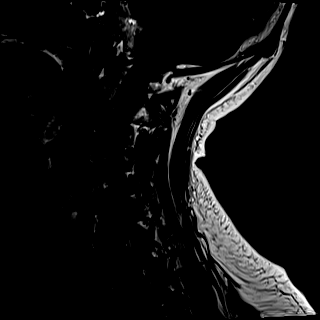
[im 9/15]
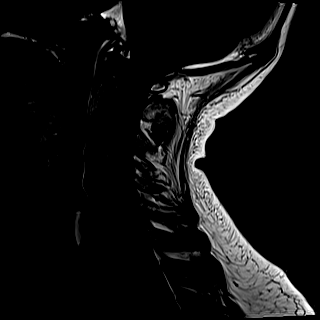
[im 12/15]
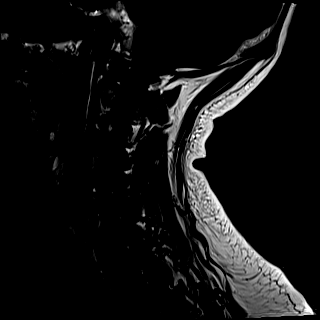
[im 15/15]
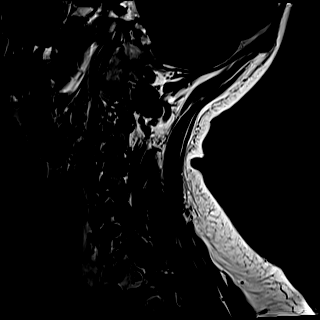

[Series 6: T2 · sagittal · 3.0mm · 0.55mm/px · 7 of 15 slices shown (1 of 2)]
[im 1/15]
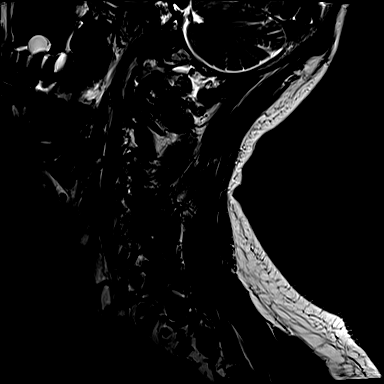
[im 3/15]
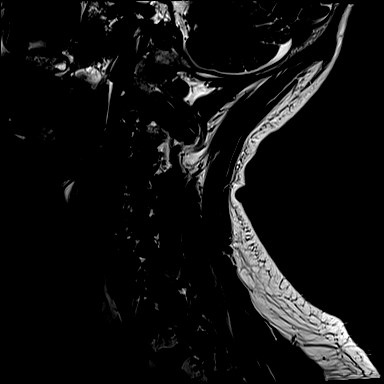
[im 5/15]
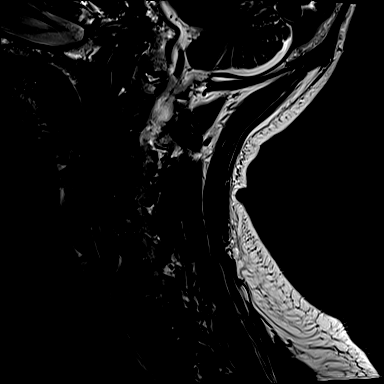
[im 8/15]
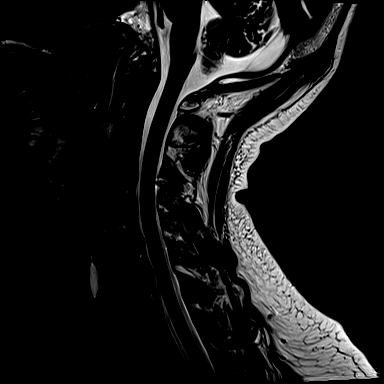
[im 10/15]
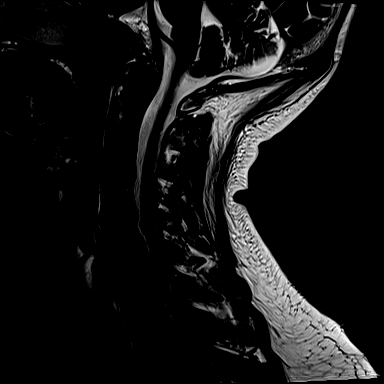
[im 12/15]
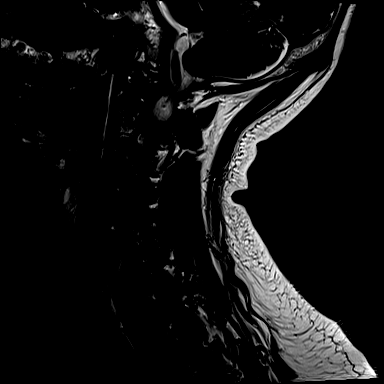
[im 15/15]
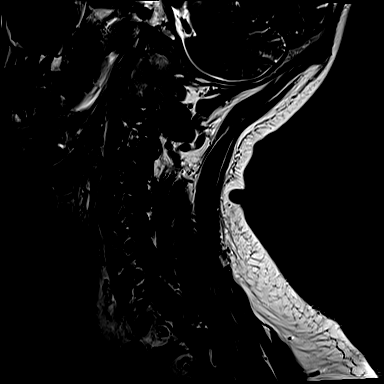

[Series 7: STIR · sagittal · 3.0mm · 0.33mm/px · 6 of 15 slices shown]
[im 1/15]
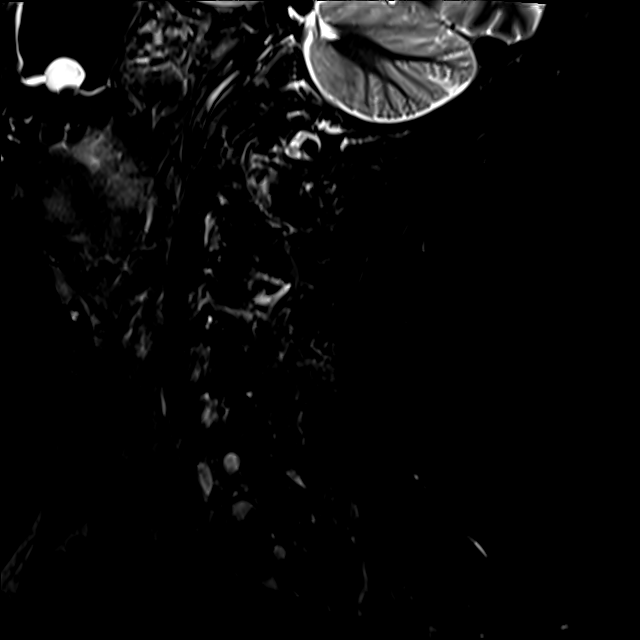
[im 3/15]
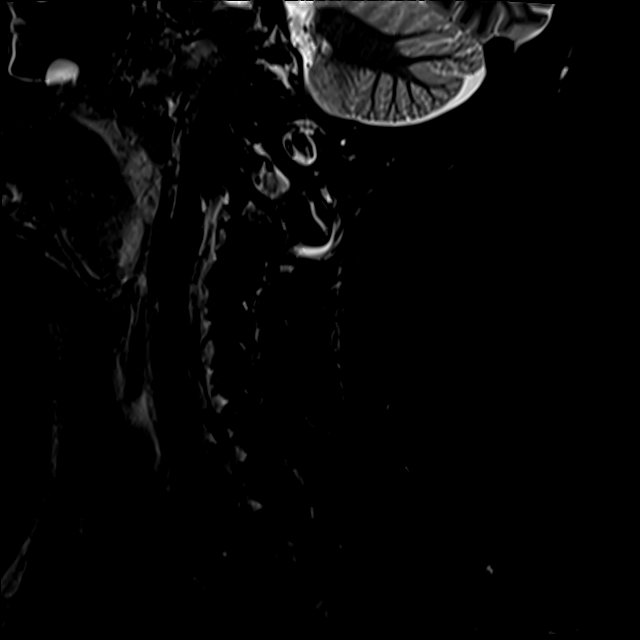
[im 5/15]
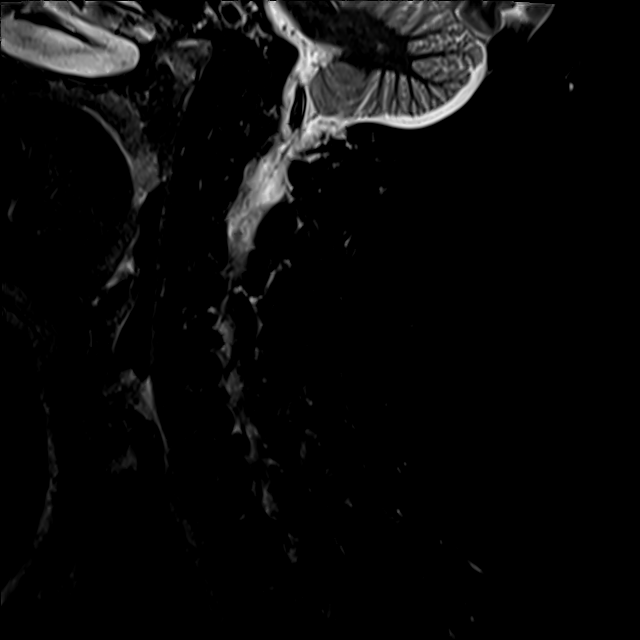
[im 8/15]
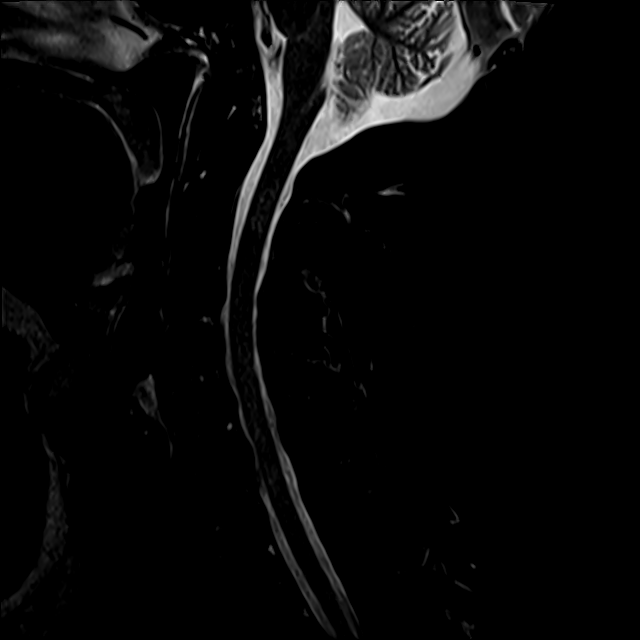
[im 10/15]
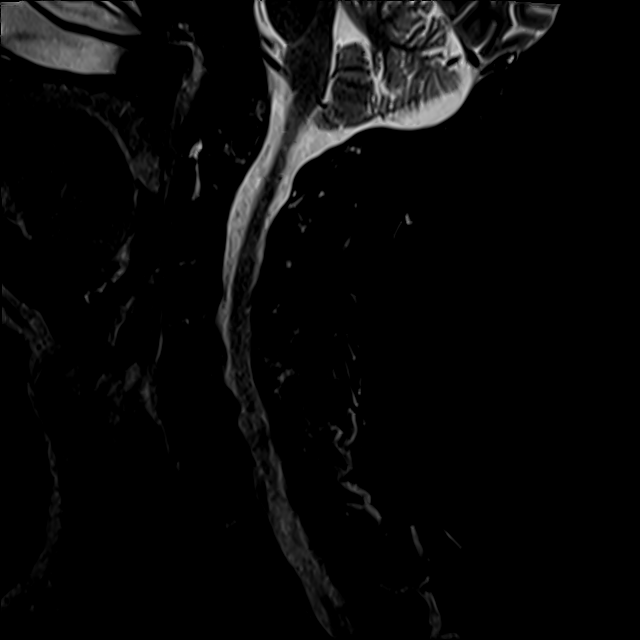
[im 12/15]
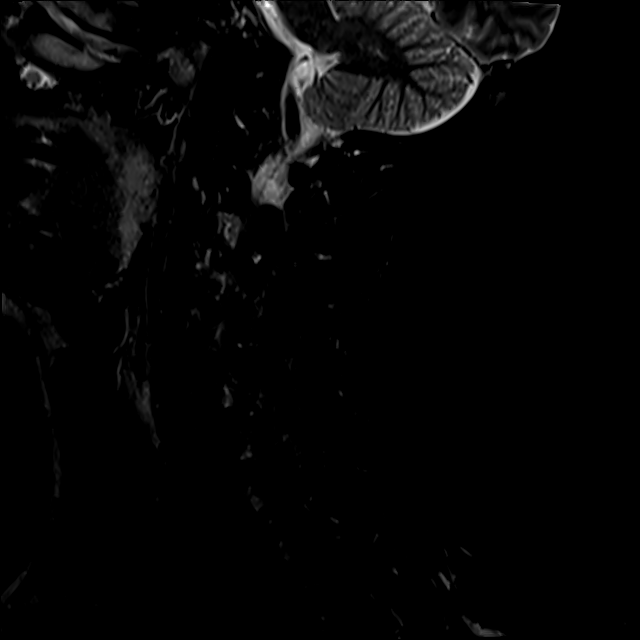

[Series 8: T2 · axial · 3.5mm · 0.50mm/px · z∈[-99,+11]mm · 8 of 30 slices shown (2 of 2)]
[im 1/30]
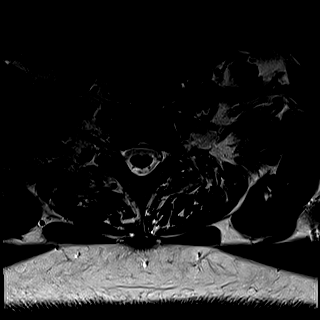
[im 5/30]
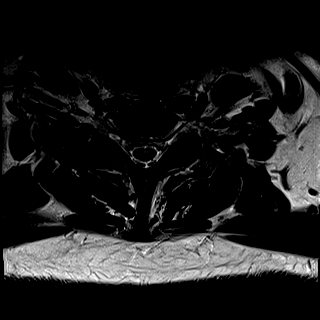
[im 9/30]
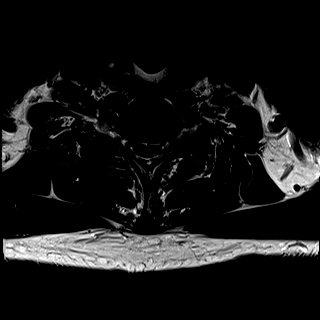
[im 14/30]
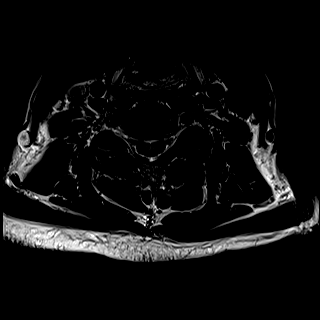
[im 16/30]
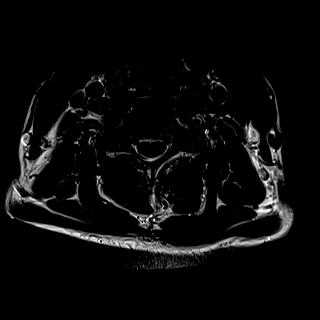
[im 21/30]
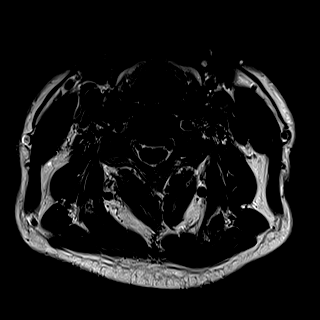
[im 25/30]
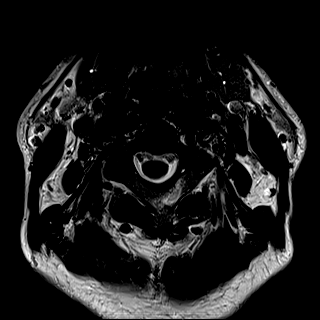
[im 30/30]
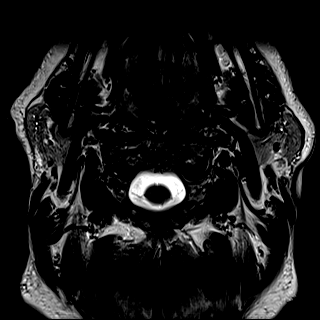

[27 of 48 positions shown; findings below may reference images not displayed]

FINDINGS: Alignment: Physiologic.

Vertebrae: No fracture, evidence of discitis, or bone lesion.

Cord: Normal signal and morphology.

Posterior Fossa, vertebral arteries, paraspinal tissues: Negative.

Disc levels:

C2-3: Minor facet spurring on the left.  No impingement

C3-4: Asymmetric left uncovertebral spurring and disc bulging. Left
facet spurring. High-grade left foraminal stenosis. Patent canal and
right foramen

C4-5: Mild disc narrowing. Bilateral uncovertebral spurring and mild
foraminal narrowing. Cystic intensity structures in the bilateral
foramina are best attributed to root sleeve cysts.

C5-6: Disc narrowing with tiny central protrusion.  No impingement

C6-7: Disc narrowing and desiccation with left more than right
uncovertebral spurring. Left foraminal impingement is advanced.
Right foraminal narrowing is mild. Left posterior disc osteophyte
complex, predominately disc, contacts the left ventral cord with
minor deformation.

C7-T1:Mild facet spurring.  No impingement
IMPRESSION: On the symptomatic left side there is advanced foraminal stenosis at
C3-4 and C6-7 primarily due to uncovertebral spurring. Mild spinal
stenosis at C6-7, where the left ventral cord is slightly flattened.

## 2018-08-23 DIAGNOSIS — Z125 Encounter for screening for malignant neoplasm of prostate: Secondary | ICD-10-CM | POA: Diagnosis not present

## 2018-08-23 DIAGNOSIS — R3914 Feeling of incomplete bladder emptying: Secondary | ICD-10-CM | POA: Diagnosis not present

## 2018-08-30 DIAGNOSIS — R3914 Feeling of incomplete bladder emptying: Secondary | ICD-10-CM | POA: Diagnosis not present

## 2018-08-30 DIAGNOSIS — N5201 Erectile dysfunction due to arterial insufficiency: Secondary | ICD-10-CM | POA: Diagnosis not present

## 2018-09-20 ENCOUNTER — Encounter: Payer: Self-pay | Admitting: *Deleted

## 2018-09-23 ENCOUNTER — Encounter: Payer: Self-pay | Admitting: Cardiology

## 2018-10-04 ENCOUNTER — Telehealth (INDEPENDENT_AMBULATORY_CARE_PROVIDER_SITE_OTHER): Payer: BLUE CROSS/BLUE SHIELD | Admitting: Cardiology

## 2018-10-04 ENCOUNTER — Encounter: Payer: Self-pay | Admitting: Cardiology

## 2018-10-04 ENCOUNTER — Other Ambulatory Visit: Payer: Self-pay

## 2018-10-04 DIAGNOSIS — I493 Ventricular premature depolarization: Secondary | ICD-10-CM

## 2018-10-04 MED ORDER — FLECAINIDE ACETATE 100 MG PO TABS: ORAL_TABLET | ORAL | 6 refills | Status: DC

## 2018-10-04 NOTE — Progress Notes (Signed)
Electrophysiology TeleHealth Note   Due to national recommendations of social distancing due to [place] 19, an audio telehealth visit is felt to be most appropriate for this patient at this time.  See MyChart message from today for the patient's consent to telehealth for [facility].   Date:  10/04/2018   ID:  Logan Diaz, DOB [birth year], MRN [id number]  Location: patient's home  Provider location: [address], [place]  Evaluation Performed: Follow-up visit  PCP:  Antony Contras, MD  Cardiologist:  Jenkins Rouge, MD  Electrophysiologist:  Neta Upadhyay Meredith Leeds, MD   Chief Complaint:  PVCs, palpitations  History of Present Illness:    Logan Diaz is a 63 y.o. male who presents via audio/video conferencing for a telehealth visit today.  Since last being seen in our clinic, the patient reports doing very well.  Today, he denies symptoms of palpitations, chest pain, shortness of breath,  lower extremity edema, dizziness, presyncope, or syncope.  The patient is otherwise without complaint today.  The patient denies symptoms of fevers, chills, cough, or new SOB worrisome for COVID 19.   He has a history of PVCs with 26% noted on recent monitor.He was subsequently put on flecainide with improvement in symptoms.  He is currently feeling well without symptoms of weakness or fatigue.  He has noted no further palpitations.  He does note that when he does not take his flecainide, such as missing a dose, by the time that he is ready for his next dose, palpitations have returned.  Otherwise he is able to do all of his activities without restriction.  When he takes his blood pressure, he no longer gets an irregular pulse reading.  He has not taken his blood pressure in a few weeks.  Past Medical History:  Diagnosis Date  . Blindness of left eye    SECONDARY TO A MAGNOLIA TREE PRICKLY BUD ACCIDENT AT AGE 14  . BPH (benign prostatic hyperplasia)   . Bradycardia   .  Complication of anesthesia    Hard to wake up  . ED (erectile dysfunction)   . History of exercise intolerance dr Curt Bears   12-10-2017  normal exercise tolerence,  no evidence ishemia by ST segment analysis  . Hyperlipidemia   . Irregular heart beat   . OA (osteoarthritis)    LEFT KNEE  . PAC (premature atrial contraction)   . Prediabetes   . PVCs (premature ventricular contractions)    cardiology EP -- dr Curt Bears--  holter monitor w/  PVCs 26% beats couplets occasional NSVT only 3 beats  . Seasonal allergies   . Umbilical hernia   . Wears glasses     Past Surgical History:  Procedure Laterality Date  . ANTERIOR CERVICAL DECOMP/DISCECTOMY FUSION  07/2017   C6-7  . CARDIOVASCULAR STRESS TEST  11-15-2017   dr Johnsie Cancel   Low risk nuclear study w/ probable inferoseptal and apical thinning but no significant ischemia;  study no gated due to ectopy  . EYE SURGERY Left x2 @ age 49   injury--  and  Catarat sx @ age 52 and 41 approx.  Marland Kitchen KNEE ARTHROSCOPY Left 1980s  . KNEE ARTHROSCOPY Left 12/23/2017   Procedure: Left knee arthroscopy, debridement removal loose bodies, chondroplasty and partial lateral meniscectomy;  Surgeon: Sydnee Cabal, MD;  Location: [facility];  Service: Orthopedics;  Laterality: Left;  45 mins / to stay over night monitor floor cardiology per [facility]  . LUMBAR DISC SURGERY  1990s  . TRANSTHORACIC ECHOCARDIOGRAM  11-15-2017   dr Johnsie Cancel   mild LVH, ef 55-60%/  mild MR, PR and TR  . UMBILICAL HERNIA REPAIR N/A 07/18/2018   Procedure: UMBILICAL HERNIA REPAIR;  Surgeon: Rolm Bookbinder, MD;  Location: [place];  Service: General;  Laterality: N/A;  GENERAL AND TAP BLOCK    Current Outpatient Medications  Medication Sig Dispense Refill  . finasteride (PROSCAR) 5 MG tablet Take 5 mg by mouth daily.   0  . flecainide (TAMBOCOR) 100 MG tablet TAKE 1 TABLET(100 MG) BY MOUTH TWICE DAILY (Patient taking differently: Take 100 mg by mouth 2 (two) times daily. )  60 tablet 10  . fluticasone (FLONASE) 50 MCG/ACT nasal spray Place 1 spray into both nostrils daily as needed for allergies.     . Glucos-Chond-Hyal Ac-Ca Fructo (MOVE FREE JOINT HEALTH ADVANCE PO) Take 1 tablet by mouth daily.    Marland Kitchen ibuprofen (ADVIL,MOTRIN) 200 MG tablet Take 3 tablets by mouth as needed for pain.    Marland Kitchen MAGNESIUM PO Take 400 mg by mouth daily. 1-2 TABLETS BY MOUTH DAILY     . Omega-3 Fatty Acids (FISH OIL PO) Take 500 mg by mouth daily.     . sildenafil (REVATIO) 20 MG tablet 20 mg. TAKE 2-5 TABLETS ONCE A DAY AS NEEDED , ONE HOUR PRIOR TO SEX     . tamsulosin (FLOMAX) 0.4 MG CAPS capsule Take 0.4 mg by mouth daily after breakfast.   0  . traMADol (ULTRAM) 50 MG tablet Take 2 tablets (100 mg total) by mouth every 6 (six) hours as needed. 10 tablet 0   No current facility-administered medications for this visit.     Allergies:   Patient has no known allergies.   Social History:  The patient  reports that he quit smoking about 43 years ago. His smoking use included cigarettes. He quit after 2.00 years of use. He has never used smokeless tobacco. He reports previous alcohol use. He reports that he does not use drugs.   Family History:  The patient's  family history includes Healthy in his daughter, father, mother, and son.   ROS:  Please see the history of present illness.   All other systems are personally reviewed and negative.    Exam:    Vital Signs:  There were no vitals taken for this visit.  The patient had not taken his blood pressure or heart rate in a few weeks.  Over the phone, feeling well, alert and conversant.  No signs of depression.   Labs/Other Tests and Data Reviewed:    Recent Labs: 07/07/2018: BUN 22; Creatinine, Ser 1.16; Hemoglobin 14.5; Platelets 259; Potassium 4.7; Sodium 140   Wt Readings from Last 3 Encounters:  07/18/18 235 lb (106.6 kg)  07/07/18 237 lb 9.6 oz (107.8 kg)  03/10/18 232 lb 12.8 oz (105.6 kg)     Other studies personally  reviewed: Additional studies/ records that were reviewed today include: ECG Review of the above records today demonstrates: Sinus rhythm     ASSESSMENT & PLAN:    1.  PVCs: Patient is feeling much improved on his current dose of flecainide.  He has noticed no PVCs when he takes his medications and he rarely misses doses.  At this point, would make no further changes to his medications.  We Denise Bramblett continue with 100 mg of flecainide.  No further changes at this time.   COVID 19 screen The patient denies symptoms of COVID 19 at this  time.  The importance of social distancing was discussed today.  Follow-up: 6 months  Current medicines are reviewed at length with the patient today.   The patient does not have concerns regarding his medicines.  The following changes were made today:  none  Labs/ tests ordered today include:  No orders of the defined types were placed in this encounter.    Patient Risk:  after full review of this patients clinical status, I feel that they are at moderate risk at this time.  Today, I have spent 12 minutes with the patient with telehealth technology discussing PVCs.    Signed, Wassim Kirksey Meredith Leeds, MD  10/04/2018 7:52 AM     [facility] [address] [place] [address] [phone] (office) [phone] (fax)

## 2018-11-07 DIAGNOSIS — M25511 Pain in right shoulder: Secondary | ICD-10-CM | POA: Diagnosis not present

## 2018-11-17 DIAGNOSIS — M25511 Pain in right shoulder: Secondary | ICD-10-CM | POA: Diagnosis not present

## 2018-11-23 DIAGNOSIS — M75121 Complete rotator cuff tear or rupture of right shoulder, not specified as traumatic: Secondary | ICD-10-CM | POA: Diagnosis not present

## 2018-11-23 DIAGNOSIS — M19011 Primary osteoarthritis, right shoulder: Secondary | ICD-10-CM | POA: Diagnosis not present

## 2019-01-06 ENCOUNTER — Other Ambulatory Visit (HOSPITAL_COMMUNITY)
Admission: RE | Admit: 2019-01-06 | Discharge: 2019-01-06 | Disposition: A | Discharge status: Home / Self Care | Payer: BC Managed Care – PPO | Source: Ambulatory Visit | Attending: Specialist | Admitting: Specialist

## 2019-01-06 DIAGNOSIS — Z1159 Encounter for screening for other viral diseases: Secondary | ICD-10-CM | POA: Insufficient documentation

## 2019-01-06 LAB — SARS CORONAVIRUS 2 (TAT 6-24 HRS): SARS Coronavirus 2: NEGATIVE

## 2019-01-09 ENCOUNTER — Encounter (HOSPITAL_BASED_OUTPATIENT_CLINIC_OR_DEPARTMENT_OTHER): Payer: Self-pay

## 2019-01-09 ENCOUNTER — Other Ambulatory Visit: Payer: Self-pay

## 2019-01-09 NOTE — Progress Notes (Addendum)
Spoke with: Dellis Filbert NPO:  No food after midnight/Clear liquids until 9:30AM DOS (ERAS/DRINK) Arrival time: 1030AM Labs: Istat 4 (COVID and EKG chart/epic) AM medications: Finasteride, Flecainide, Tamsulosin Pre op orders: Yes Ride home:  Arrie Aran (wife) [phone]

## 2019-01-09 NOTE — Progress Notes (Signed)
SPOKE W/  Dellis Filbert     SCREENING SYMPTOMS OF COVID 19:   COUGH--NO  RUNNY NOSE--- NO  SORE THROAT---NO  NASAL CONGESTION----NO  SNEEZING----NO  SHORTNESS OF BREATH---NO  DIFFICULTY BREATHING---NO  TEMP >100.0 -----NO  UNEXPLAINED BODY ACHES------NO  CHILLS -------- NO  HEADACHES ---------NO  LOSS OF SMELL/ TASTE --------NO    HAVE YOU OR ANY FAMILY MEMBER TRAVELLED PAST 14 DAYS OUT OF THE   COUNTY---NO STATE----NO COUNTRY----NO  HAVE YOU OR ANY FAMILY MEMBER BEEN EXPOSED TO ANYONE WITH COVID 19?NO

## 2019-01-10 ENCOUNTER — Ambulatory Visit (HOSPITAL_BASED_OUTPATIENT_CLINIC_OR_DEPARTMENT_OTHER)
Admission: RE | Admit: 2019-01-10 | Discharge: 2019-01-10 | Disposition: A | Discharge status: Home / Self Care | Payer: BC Managed Care – PPO | Source: Ambulatory Visit | Attending: Specialist | Admitting: Specialist

## 2019-01-10 ENCOUNTER — Ambulatory Visit (HOSPITAL_BASED_OUTPATIENT_CLINIC_OR_DEPARTMENT_OTHER): Payer: BC Managed Care – PPO | Admitting: Anesthesiology

## 2019-01-10 ENCOUNTER — Encounter (HOSPITAL_BASED_OUTPATIENT_CLINIC_OR_DEPARTMENT_OTHER)
Admission: RE | Disposition: A | Discharge status: Home / Self Care | Payer: Self-pay | Source: Ambulatory Visit | Attending: Specialist

## 2019-01-10 ENCOUNTER — Encounter (HOSPITAL_BASED_OUTPATIENT_CLINIC_OR_DEPARTMENT_OTHER): Payer: Self-pay | Admitting: Anesthesiology

## 2019-01-10 ENCOUNTER — Other Ambulatory Visit: Payer: Self-pay

## 2019-01-10 DIAGNOSIS — X58XXXA Exposure to other specified factors, initial encounter: Secondary | ICD-10-CM | POA: Insufficient documentation

## 2019-01-10 DIAGNOSIS — S46191A Other injury of muscle, fascia and tendon of long head of biceps, right arm, initial encounter: Secondary | ICD-10-CM | POA: Diagnosis not present

## 2019-01-10 DIAGNOSIS — M19011 Primary osteoarthritis, right shoulder: Secondary | ICD-10-CM | POA: Diagnosis not present

## 2019-01-10 DIAGNOSIS — G8918 Other acute postprocedural pain: Secondary | ICD-10-CM | POA: Diagnosis not present

## 2019-01-10 DIAGNOSIS — N400 Enlarged prostate without lower urinary tract symptoms: Secondary | ICD-10-CM | POA: Insufficient documentation

## 2019-01-10 DIAGNOSIS — M75121 Complete rotator cuff tear or rupture of right shoulder, not specified as traumatic: Secondary | ICD-10-CM | POA: Diagnosis not present

## 2019-01-10 DIAGNOSIS — I491 Atrial premature depolarization: Secondary | ICD-10-CM | POA: Diagnosis not present

## 2019-01-10 DIAGNOSIS — H5462 Unqualified visual loss, left eye, normal vision right eye: Secondary | ICD-10-CM | POA: Insufficient documentation

## 2019-01-10 DIAGNOSIS — Z87891 Personal history of nicotine dependence: Secondary | ICD-10-CM | POA: Insufficient documentation

## 2019-01-10 DIAGNOSIS — S46091A Other injury of muscle(s) and tendon(s) of the rotator cuff of right shoulder, initial encounter: Secondary | ICD-10-CM | POA: Diagnosis not present

## 2019-01-10 DIAGNOSIS — Z79899 Other long term (current) drug therapy: Secondary | ICD-10-CM | POA: Diagnosis not present

## 2019-01-10 DIAGNOSIS — S46111A Strain of muscle, fascia and tendon of long head of biceps, right arm, initial encounter: Secondary | ICD-10-CM | POA: Diagnosis not present

## 2019-01-10 DIAGNOSIS — E785 Hyperlipidemia, unspecified: Secondary | ICD-10-CM | POA: Insufficient documentation

## 2019-01-10 DIAGNOSIS — M75101 Unspecified rotator cuff tear or rupture of right shoulder, not specified as traumatic: Secondary | ICD-10-CM | POA: Diagnosis not present

## 2019-01-10 DIAGNOSIS — M1711 Unilateral primary osteoarthritis, right knee: Secondary | ICD-10-CM | POA: Diagnosis not present

## 2019-01-10 HISTORY — DX: Unspecified malignant neoplasm of skin, unspecified: C44.90

## 2019-01-10 HISTORY — DX: Spinal stenosis, cervical region: M48.02

## 2019-01-10 HISTORY — PX: SHOULDER ARTHROSCOPY WITH ROTATOR CUFF REPAIR: SHX5685

## 2019-01-10 HISTORY — DX: Unspecified rotator cuff tear or rupture of right shoulder, not specified as traumatic: M75.101

## 2019-01-10 HISTORY — DX: Prediabetes: R73.03

## 2019-01-10 LAB — POCT I-STAT 4, (NA,K, GLUC, HGB,HCT)
Glucose, Bld: 105 mg/dL — ABNORMAL HIGH (ref 70–99)
HCT: 42 % (ref 39.0–52.0)
Hemoglobin: 14.3 g/dL (ref 13.0–17.0)
Potassium: 4.6 mmol/L (ref 3.5–5.1)
Sodium: 140 mmol/L (ref 135–145)

## 2019-01-10 SURGERY — ARTHROSCOPY, SHOULDER, WITH ROTATOR CUFF REPAIR
Anesthesia: General | Laterality: Right

## 2019-01-10 MED ORDER — BUPIVACAINE LIPOSOME 1.3 % IJ SUSP
INTRAMUSCULAR | Status: DC | PRN
  Administered 2019-01-10: 10 mL

## 2019-01-10 MED ORDER — MIDAZOLAM HCL 2 MG/2ML IJ SOLN
2.0000 mg | Freq: Once | INTRAMUSCULAR | Status: AC
  Administered 2019-01-10: 1 mg via INTRAVENOUS
  Filled 2019-01-10: qty 2

## 2019-01-10 MED ORDER — CEFAZOLIN SODIUM-DEXTROSE 2-4 GM/100ML-% IV SOLN
INTRAVENOUS | Status: AC
  Filled 2019-01-10: qty 100

## 2019-01-10 MED ORDER — OXYCODONE HCL 5 MG PO TABS
5.0000 mg | ORAL_TABLET | Freq: Once | ORAL | Status: DC | PRN
  Filled 2019-01-10: qty 1

## 2019-01-10 MED ORDER — MIDAZOLAM HCL 2 MG/2ML IJ SOLN
INTRAMUSCULAR | Status: AC
  Filled 2019-01-10: qty 2

## 2019-01-10 MED ORDER — FENTANYL CITRATE (PF) 100 MCG/2ML IJ SOLN
100.0000 ug | Freq: Once | INTRAMUSCULAR | Status: AC
  Administered 2019-01-10: 50 ug via INTRAVENOUS
  Filled 2019-01-10: qty 2

## 2019-01-10 MED ORDER — OXYCODONE HCL 5 MG/5ML PO SOLN
5.0000 mg | Freq: Once | ORAL | Status: DC | PRN
  Filled 2019-01-10: qty 5

## 2019-01-10 MED ORDER — FENTANYL CITRATE (PF) 100 MCG/2ML IJ SOLN
25.0000 ug | INTRAMUSCULAR | Status: DC | PRN
  Filled 2019-01-10: qty 1

## 2019-01-10 MED ORDER — FENTANYL CITRATE (PF) 100 MCG/2ML IJ SOLN
INTRAMUSCULAR | Status: AC
  Filled 2019-01-10: qty 2

## 2019-01-10 MED ORDER — EPHEDRINE SULFATE-NACL 50-0.9 MG/10ML-% IV SOSY
PREFILLED_SYRINGE | INTRAVENOUS | Status: DC | PRN
  Administered 2019-01-10 (×3): 10 mg via INTRAVENOUS

## 2019-01-10 MED ORDER — ONDANSETRON HCL 4 MG/2ML IJ SOLN
INTRAMUSCULAR | Status: AC
  Filled 2019-01-10: qty 2

## 2019-01-10 MED ORDER — DEXAMETHASONE SODIUM PHOSPHATE 10 MG/ML IJ SOLN
INTRAMUSCULAR | Status: AC
  Filled 2019-01-10: qty 1

## 2019-01-10 MED ORDER — DEXAMETHASONE SODIUM PHOSPHATE 4 MG/ML IJ SOLN
INTRAMUSCULAR | Status: DC | PRN
  Administered 2019-01-10: 10 mg via INTRAVENOUS

## 2019-01-10 MED ORDER — ONDANSETRON HCL 4 MG/2ML IJ SOLN
INTRAMUSCULAR | Status: DC | PRN
  Administered 2019-01-10: 4 mg via INTRAVENOUS

## 2019-01-10 MED ORDER — LIDOCAINE 2% (20 MG/ML) 5 ML SYRINGE
INTRAMUSCULAR | Status: AC
  Filled 2019-01-10: qty 5

## 2019-01-10 MED ORDER — ONDANSETRON HCL 4 MG/2ML IJ SOLN
4.0000 mg | Freq: Once | INTRAMUSCULAR | Status: DC | PRN
  Filled 2019-01-10: qty 2

## 2019-01-10 MED ORDER — LACTATED RINGERS IV SOLN
INTRAVENOUS | Status: DC
  Administered 2019-01-10 (×2): via INTRAVENOUS
  Filled 2019-01-10: qty 1000

## 2019-01-10 MED ORDER — SODIUM CHLORIDE 0.9 % IR SOLN
Status: DC | PRN
  Administered 2019-01-10: 9000 mL

## 2019-01-10 MED ORDER — CEFAZOLIN SODIUM-DEXTROSE 2-4 GM/100ML-% IV SOLN
2.0000 g | INTRAVENOUS | Status: AC
  Administered 2019-01-10: 2 g via INTRAVENOUS
  Filled 2019-01-10: qty 100

## 2019-01-10 MED ORDER — FENTANYL CITRATE (PF) 100 MCG/2ML IJ SOLN
INTRAMUSCULAR | Status: DC | PRN
  Administered 2019-01-10 (×2): 50 ug via INTRAVENOUS

## 2019-01-10 MED ORDER — LIDOCAINE 2% (20 MG/ML) 5 ML SYRINGE
INTRAMUSCULAR | Status: DC | PRN
  Administered 2019-01-10: 60 mg via INTRAVENOUS

## 2019-01-10 MED ORDER — SUCCINYLCHOLINE CHLORIDE 200 MG/10ML IV SOSY
PREFILLED_SYRINGE | INTRAVENOUS | Status: AC
  Filled 2019-01-10: qty 10

## 2019-01-10 MED ORDER — BUPIVACAINE-EPINEPHRINE (PF) 0.5% -1:200000 IJ SOLN
INTRAMUSCULAR | Status: DC | PRN
  Administered 2019-01-10: 20 mL via PERINEURAL

## 2019-01-10 MED ORDER — PROPOFOL 10 MG/ML IV BOLUS
INTRAVENOUS | Status: DC | PRN
  Administered 2019-01-10: 200 mg via INTRAVENOUS

## 2019-01-10 MED ORDER — SUCCINYLCHOLINE CHLORIDE 20 MG/ML IJ SOLN
INTRAMUSCULAR | Status: DC | PRN
  Administered 2019-01-10: 120 mg via INTRAVENOUS

## 2019-01-10 MED ORDER — SODIUM CHLORIDE (PF) 0.9 % IJ SOLN
INTRAMUSCULAR | Status: DC | PRN
  Administered 2019-01-10: 14:00:00 12 mL via INTRA_ARTICULAR

## 2019-01-10 SURGICAL SUPPLY — 81 items
ANCH SUT 17.9 PEEK SWLK (Orthopedic Implant) ×1 IMPLANT
ANCH SUT SWLK 19.1X4.75 VT (Anchor) ×2 IMPLANT
ANCHOR PEEK 4.75X19.1 SWLK C (Anchor) ×2 IMPLANT
BLADE EXCALIBUR 4.0X13 (MISCELLANEOUS) ×2 IMPLANT
BLADE SURG 11 STRL SS (BLADE) ×2 IMPLANT
BLADE SURG 15 STRL LF DISP TIS (BLADE) ×1 IMPLANT
BLADE SURG 15 STRL SS (BLADE) ×2
BURR CLEARCUT OVAL 5.5X13 (MISCELLANEOUS) ×1 IMPLANT
BURR OVAL 12 FL 5.5X13 (MISCELLANEOUS) ×1
BURR OVAL 8 FLU 5.0X13 (MISCELLANEOUS) ×2 IMPLANT
CANNULA 5.75X7 CRYSTAL CLEAR (CANNULA) ×1 IMPLANT
CANNULA 5.75X71 LONG (CANNULA) IMPLANT
CANNULA TWIST IN 8.25X7CM (CANNULA) ×2 IMPLANT
CONNECTOR 5 IN 1 STRAIGHT STRL (MISCELLANEOUS) ×2 IMPLANT
COVER WAND RF STERILE (DRAPES) ×2 IMPLANT
DECANTER SPIKE VIAL GLASS SM (MISCELLANEOUS) ×2 IMPLANT
DRAPE ORTHO SPLIT 77X108 STRL (DRAPES) ×4
DRAPE POUCH INSTRU U-SHP 10X18 (DRAPES) ×2 IMPLANT
DRAPE SHEET LG 3/4 BI-LAMINATE (DRAPES) ×2 IMPLANT
DRAPE STERI 35X30 U-POUCH (DRAPES) ×2 IMPLANT
DRAPE SURG 17X23 STRL (DRAPES) ×2 IMPLANT
DRAPE SURG ORHT 6 SPLT 77X108 (DRAPES) ×2 IMPLANT
DRAPE U-SHAPE 47X51 STRL (DRAPES) ×2 IMPLANT
DURAPREP 26ML APPLICATOR (WOUND CARE) ×2 IMPLANT
DW OUTFLOW CASSETTE/TUBE SET (MISCELLANEOUS) ×2 IMPLANT
ELECT MENISCUS 165MM 90D (ELECTRODE) IMPLANT
ELECT REM PT RETURN 9FT ADLT (ELECTROSURGICAL) ×2
ELECTRODE REM PT RTRN 9FT ADLT (ELECTROSURGICAL) ×1 IMPLANT
EXCALIBUR 3.8MM X 13CM (MISCELLANEOUS) ×2 IMPLANT
FIBER TAPE 2MM (SUTURE) ×3 IMPLANT
FIBERSTICK 2 (SUTURE) IMPLANT
GAUZE SPONGE 4X4 12PLY STRL (GAUZE/BANDAGES/DRESSINGS) ×2 IMPLANT
GAUZE XEROFORM 1X8 LF (GAUZE/BANDAGES/DRESSINGS) ×2 IMPLANT
GLOVE BIO SURGEON STRL SZ8 (GLOVE) ×2 IMPLANT
GLOVE INDICATOR 8.0 STRL GRN (GLOVE) ×2 IMPLANT
GOWN STRL REUS W/TWL XL LVL3 (GOWN DISPOSABLE) ×4 IMPLANT
KIT TURNOVER CYSTO (KITS) ×2 IMPLANT
LASSO SUT 90 DEGREE (SUTURE) IMPLANT
MANIFOLD NEPTUNE II (INSTRUMENTS) ×2 IMPLANT
NDL 1/2 CIR CATGUT .05X1.09 (NEEDLE) IMPLANT
NDL SCORPION MULTI FIRE (NEEDLE) IMPLANT
NEEDLE 1/2 CIR CATGUT .05X1.09 (NEEDLE) IMPLANT
NEEDLE HYPO 22GX1.5 SAFETY (NEEDLE) ×2 IMPLANT
NEEDLE SCORPION MULTI FIRE (NEEDLE) IMPLANT
NS IRRIG 500ML POUR BTL (IV SOLUTION) IMPLANT
PACK ARTHROSCOPY DSU (CUSTOM PROCEDURE TRAY) ×2 IMPLANT
PACK BASIN DAY SURGERY FS (CUSTOM PROCEDURE TRAY) ×2 IMPLANT
PAD ABD 8X10 STRL (GAUZE/BANDAGES/DRESSINGS) ×2 IMPLANT
PAD ARMBOARD 7.5X6 YLW CONV (MISCELLANEOUS) IMPLANT
PEEK SWIVELOCK SHOU 3.9 (Orthopedic Implant) ×1 IMPLANT
PENCIL BUTTON HOLSTER BLD 10FT (ELECTRODE) IMPLANT
PORT APPOLLO RF 90DEGREE MULTI (SURGICAL WAND) ×2 IMPLANT
SLEEVE ARM SUSPENSION SYSTEM (MISCELLANEOUS) ×2 IMPLANT
SLING ARM FOAM STRAP LRG (SOFTGOODS) ×1 IMPLANT
SLING S3 LATERAL DISP (MISCELLANEOUS) IMPLANT
SLING ULTRA II L (ORTHOPEDIC SUPPLIES) ×2 IMPLANT
SPONGE LAP 4X18 RFD (DISPOSABLE) IMPLANT
SUT 2 FIBERLOOP 20 STRT BLUE (SUTURE)
SUT ETHILON 3 0 PS 1 (SUTURE) ×2 IMPLANT
SUT FIBERWIRE #2 38 T-5 BLUE (SUTURE)
SUT LASSO 45 DEGREE LEFT (SUTURE) IMPLANT
SUT LASSO 45D RIGHT (SUTURE) IMPLANT
SUT PDS AB 0 CT1 36 (SUTURE) IMPLANT
SUT TIGER TAPE 7 IN WHITE (SUTURE) ×1 IMPLANT
SUT VIC AB 0 CT1 36 (SUTURE) IMPLANT
SUT VIC AB 2-0 CT1 27 (SUTURE)
SUT VIC AB 2-0 CT1 TAPERPNT 27 (SUTURE) IMPLANT
SUTURE 2 FIBERLOOP 20 STRT BLU (SUTURE) IMPLANT
SUTURE FIBERWR #2 38 T-5 BLUE (SUTURE) IMPLANT
SUTURE TAPE 1.3 40 TPR END (SUTURE) ×2 IMPLANT
SUTURETAPE 1.3 40 TPR END (SUTURE) ×4
SYR CONTROL 10ML LL (SYRINGE) ×2 IMPLANT
SYR TB 1ML 27GX1/2 SAFE (SYRINGE) ×1 IMPLANT
SYR TB 1ML 27GX1/2 SAFETY (SYRINGE) ×2
TAPE CLOTH SURG 6X10 WHT LF (GAUZE/BANDAGES/DRESSINGS) ×1 IMPLANT
TAPE FIBER 2MM 7IN #2 BLUE (SUTURE) ×2 IMPLANT
TOWEL OR 17X26 10 PK STRL BLUE (TOWEL DISPOSABLE) ×2 IMPLANT
TUBE CONNECTING 12X1/4 (SUCTIONS) ×2 IMPLANT
TUBING ARTHROSCOPY IRRIG 16FT (MISCELLANEOUS) ×2 IMPLANT
TUBING REDEUCE PUMP W/CON 8IN (MISCELLANEOUS) ×2 IMPLANT
WATER STERILE IRR 500ML POUR (IV SOLUTION) ×2 IMPLANT

## 2019-01-10 NOTE — Anesthesia Procedure Notes (Signed)
Anesthesia Regional Block: Interscalene brachial plexus block   Pre-Anesthetic Checklist: ,, timeout performed, Correct Patient, Correct Site, Correct Laterality, Correct Procedure, Correct Position, site marked, Risks and benefits discussed,  Surgical consent,  Pre-op evaluation,  At surgeon's request and post-op pain management  Laterality: Right  Prep: chloraprep       Needles:  Injection technique: Single-shot  Needle Type: Echogenic Stimulator Needle     Needle Length: 9cm  Needle Gauge: 21   Needle insertion depth: 6 cm   Additional Needles:   Procedures:,,,, ultrasound used (permanent image in chart),,,,  Narrative:  Start time: 01/10/2019 11:53 AM End time: 01/10/2019 11:58 AM Injection made incrementally with aspirations every 5 mL.  Performed by: Personally  Anesthesiologist: Josephine Igo, MD  Additional Notes: Timeout performed. Patient sedated. Relevant anatomy ID'd using [name]. Incremental 2-31ml injection of LA with frequent aspiration. Patient tolerated procedure well.        Right Interscalene block

## 2019-01-10 NOTE — Anesthesia Postprocedure Evaluation (Signed)
Anesthesia Post Note  Patient: Logan Diaz  Procedure(s) Performed: SHOULDER ARTHROSCOPY WITH ROTATOR CUFF REPAIR biceps tenodesis, subacromial decompression, distal calvicle excision (Right )     Patient location during evaluation: PACU Anesthesia Type: General and Regional Level of consciousness: awake and alert and oriented Pain management: pain level controlled Vital Signs Assessment: post-procedure vital signs reviewed and stable Respiratory status: spontaneous breathing, nonlabored ventilation and respiratory function stable Cardiovascular status: blood pressure returned to baseline and stable Postop Assessment: no apparent nausea or vomiting Anesthetic complications: no    Last Vitals:  Vitals:   01/10/19 1545 01/10/19 1600  BP: (!) 145/79   Pulse: 85 84  Resp: 11 17  Temp:    SpO2: 94% 94%    Last Pain:  Vitals:   01/10/19 1600  TempSrc:   PainSc: 0-No pain                 Iqra Rotundo A.

## 2019-01-10 NOTE — Progress Notes (Signed)
Assisted Dr. Royce Macadamia with right, ultrasound guided, interscalene  block. Side rails up, monitors on throughout procedure. See vital signs in flow sheet. Tolerated Procedure well.

## 2019-01-10 NOTE — Op Note (Signed)
Dictated#preop diagnosis right shoulder rotator cuff tear.  #2 subluxed biceps tendon #3 AC joint degenerative arthritis Postop diagnosis #1 right shoulder complete tear supraspinatus tendon partial tear infraspinatus tendon. 2.  Subluxed and partially torn biceps tendon #3 degenerative labral tearing #4 AC joint degenerative arthritis Procedure #1 right shoulder arthroscopic rotator cuff repair #2 right shoulder arthroscopic subacromial decompression acromioplasty CA ligament release bursectomy #3 arthroscopic distal clavicle resection #4 arthroscopic biceps tenodesis Surgeon Hart Robinsons, MD [place] Anesthesia interscalene block and general Estimated blood loss minimal Complications none Disposition PACU stable  Operative details Patient counseled in the holding area interscalene block had been administered correct side was marked and signed appropriately.  IV antibiotics were given.  Patient was taken to the OR placed in supine position under general anesthesia.  He was then turned into a left lateral decubitus position properly in padded right shoulder examined full range of motion and stable.  Utilizing the Arthrex sterile shoulder holder at 30 degrees of abduction 10 degrees of forward flexion 15 pounds of longitudinal traction making sure not to over distract the extremity.  Prepped with DuraPrep and draped into a sterile fashion.  Timeout done confirm right side.  Posterior portal was created scope placed into the glenohumeral joint immediately identifiable was the rotator cuff tear as above described.  Slight retraction.  Biceps tendinopathy partial tearing and subluxation medially.  Degenerative labral tearing.  Intact articular cartilage and glenohumeral ligaments instability.  Lateral portal was established neurovascular structures were protected to the axillary nerve and the labrum was debrided with cautery and shaver back to healthy tissue.  Subacromial region was  inspected subacromial subdeltoid bursectomy was performed.  Rotator cuff mobilized debrided back to healthy tissue soft tissues moved the greater tuberosity bur down to bleeding bone with a bur.  Cautery was utilized to take down the periosteum and CA ligament burs the posterior utilizing the cutting block technique an anterior inferior lateral acromioplasty was performed converting to a type I acromion morphology.  AC joint was found to be markedly osteoarthritic with some chondrocalcinosis and also an underlying subclavicular spur excessive Interpore was made birds introduced the lateral 5 to 8 mm of the clavicle was removed circumferentially thick discharge leave the superior capsule intact debris was removed clavicle was stable.  At this point time utilizing the scorpion from Arthrex to fiber tapes were placed through the biceps tendon then into a swivel lock and then tapped and screwed into the inner intertubercular groove at the appropriate tension and length.  I then released the biceps off the supraglenoid tubercle and debrided the intra-articular portion with a shaver.  Upon the biceps tenodesis a small puncture was made superiorly and Arthrex peek anchors placed to the greater tuberosity 4 limbs of suture were placed all were fiber tape.  Abducted to another swivel lock for suture bridge double row technique.  At this time we had an excellent.  The rotator cuff healthy tissue down to good bone with excellent fixation.  No other abnormalities.  Irrigated arthroscopic equipment was removed.  Weight was removed.  Portals closed with 4 nylon sutures sterile dressings applied turned supine placed in an abduction sling awakened and taken from the operating room to the PACU in stable condition.  He will be stabilized in PACU and discharged to home.  To help with patient positioning prepping draping surgical assistance throughout the entire case suture made wound closure and dressing and sling Mr. Molli Barrows,  PA-C assistance was needed throughout the  entire case

## 2019-01-10 NOTE — Transfer of Care (Signed)
  Last Vitals:  Vitals Value Taken Time  BP 149/82 01/10/19 1530  Temp 37 C 01/10/19 1526  Pulse 86 01/10/19 1531  Resp 12 01/10/19 1531  SpO2 96 % 01/10/19 1531  Vitals shown include unvalidated device data.  Last Pain:  Vitals:   01/10/19 1526  TempSrc:   PainSc: Asleep      Patients Stated Pain Goal: 7 (01/10/19 1119)  Immediate Anesthesia Transfer of Care Note  Patient: Logan Diaz  Procedure(s) Performed: Procedure(s) (LRB): SHOULDER ARTHROSCOPY WITH ROTATOR CUFF REPAIR biceps tenodesis, subacromial decompression, distal calvicle excision (Right)  Patient Location: PACU  Anesthesia Type: General  Level of Consciousness: awake, alert  and oriented  Airway & Oxygen Therapy: Patient Spontanous Breathing and Patient connected to nasal cannula oxygen  Post-op Assessment: Report given to PACU RN and Post -op Vital signs reviewed and stable  Post vital signs: Reviewed and stable  Complications: No apparent anesthesia complications

## 2019-01-10 NOTE — Discharge Instructions (Signed)
Leave arm in sling as long as block is working.  When block stops working may move elbow hand and wrist but do not lift shoulder.  Ice pack to shoulder every 3 hours for 20 minutes as necessary for pain.  Leave dressing in place may remove on Thursday/ shower reapply Neosporin and Band-Aids.  Follow-up in office in 5 to 7 days please call for appointment with Dr. Theda Sers or assistant.  Your medicines have been sent in electronically to your pharmacy.  They will be Percocet Keflex and Robaxin.  The Percocet  will be 1 every 4-6 hours as necessary for pain.  Keflex 1 3 times a day until finished.  And Robaxin 500 mg 1 every 8 hours as necessary for spasm.  Call office for problems or concerns.  Call your surgeon if you experience:   1.  Fever over 101.0. 2.  Nausea and/or vomiting. 3.  Extreme swelling or bruising at the surgical site. 4.  Continued bleeding from the incision. 5.  Increased pain, redness or drainage from the incision. 6.  Problems related to your pain medication. 7.  Any problems and/or concerns  Regional Anesthesia Blocks  1. Numbness or the inability to move the "blocked" extremity may last from 3-48 hours after placement. The length of time depends on the medication injected and your individual response to the medication. If the numbness is not going away after 48 hours, call your surgeon.  2. The extremity that is blocked will need to be protected until the numbness is gone and the  Strength has returned. Because you cannot feel it, you will need to take extra care to avoid injury. Because it may be weak, you may have difficulty moving it or using it. You may not know what position it is in without looking at it while the block is in effect.  3. For blocks in the legs and feet, returning to weight bearing and walking needs to be done carefully. You will need to wait until the numbness is entirely gone and the strength has returned. You should be able to move your leg and foot  normally before you try and bear weight or walk. You will need someone to be with you when you first try to ensure you do not fall and possibly risk injury.  4. Bruising and tenderness at the needle site are common side effects and will resolve in a few days.  5. Persistent numbness or new problems with movement should be communicated to the surgeon or the [facility] [phone] [place] [phone]).Information for Discharge Teaching: EXPAREL (bupivacaine liposome injectable suspension)   Your surgeon or anesthesiologist gave you EXPAREL(bupivacaine) to help control your pain after surgery.   EXPAREL is a local anesthetic that provides pain relief by numbing the tissue around the surgical site.  EXPAREL is designed to release pain medication over time and can control pain for up to 72 hours.  Depending on how you respond to EXPAREL, you may require less pain medication during your recovery.  Possible side effects:  Temporary loss of sensation or ability to move in the area where bupivacaine was injected.  Nausea, vomiting, constipation  Rarely, numbness and tingling in your mouth or lips, lightheadedness, or anxiety may occur.  Call your doctor right away if you think you may be experiencing any of these sensations, or if you have other questions regarding possible side effects.  Follow all other discharge instructions given to you by your surgeon or  nurse. Eat a healthy diet and drink plenty of water or other fluids.  If you return to the hospital for any reason within 96 hours following the administration of EXPAREL, it is important for health care providers to know that you have received this anesthetic. A teal colored band has been placed on your arm with the date, time and amount of EXPAREL you have received in order to alert and inform your health care providers. Please leave this armband in place for the full 96 hours following administration, and  then you may remove the band.

## 2019-01-10 NOTE — Anesthesia Procedure Notes (Signed)
Procedure Name: Intubation Date/Time: 01/10/2019 1:33 PM Performed by: Eulas Post, Filemon Breton W, CRNA Pre-anesthesia Checklist: Patient identified, Emergency Drugs available, Suction available and Patient being monitored Patient Re-evaluated:Patient Re-evaluated prior to induction Oxygen Delivery Method: Circle system utilized Preoxygenation: Pre-oxygenation with 100% oxygen Induction Type: IV induction Ventilation: Mask ventilation without difficulty Laryngoscope Size: Miller and 2 Grade View: Grade I Tube type: Oral Tube size: 8.0 mm Number of attempts: 1 Airway Equipment and Method: Stylet Placement Confirmation: ETT inserted through vocal cords under direct vision,  positive ETCO2 and breath sounds checked- equal and bilateral Secured at: 20 cm Tube secured with: Tape Dental Injury: Teeth and Oropharynx as per pre-operative assessment

## 2019-01-10 NOTE — Anesthesia Preprocedure Evaluation (Addendum)
Anesthesia Evaluation  Patient identified by MRN, date of birth, ID band Patient awake    Reviewed: Allergy & Precautions, NPO status , Patient's Chart, lab work & pertinent test results  History of Anesthesia Complications (+) PROLONGED EMERGENCE and history of anesthetic complications  Airway Mallampati: II  TM Distance: >3 FB Neck ROM: Full    Dental no notable dental hx. (+) Teeth Intact   Pulmonary former smoker,    Pulmonary exam normal breath sounds clear to auscultation       Cardiovascular negative cardio ROS Normal cardiovascular exam+ dysrhythmias Ventricular Tachycardia  Rhythm:Regular Rate:Normal  Hx/o frequent PVC's and NSVT controlled on flecanide Normal myocardial perfusion scan 11/15/2017 Blood pressure demonstrated a normal response to exercise. There was no ST segment deviation noted during stress. Defect 1: There is a medium defect of moderate severity present in the basal inferoseptal, mid inferoseptal and apex location. This is a low risk study.     Neuro/Psych Blind OS negative psych ROS   GI/Hepatic negative GI ROS, Neg liver ROS,   Endo/Other  Hyperlipidemia Obesity  Renal/GU negative Renal ROS   BPH  ED    Musculoskeletal  (+) Arthritis , Osteoarthritis,  Torn rotator cuff right shoulder Biceps tendon subluxation Spinal stenosis   Abdominal (+) + obese,   Peds  Hematology negative hematology ROS (+)   Anesthesia Other Findings   Reproductive/Obstetrics negative OB ROS                             Anesthesia Physical Anesthesia Plan  ASA: II  Anesthesia Plan: General   Post-op Pain Management:  Regional for Post-op pain   Induction: Intravenous  PONV Risk Score and Plan: 3 and Midazolam, Ondansetron, Dexamethasone and Treatment may vary due to age or medical condition  Airway Management Planned: Oral ETT  Additional Equipment:   Intra-op  Plan:   Post-operative Plan: Extubation in OR  Informed Consent: I have reviewed the patients History and Physical, chart, labs and discussed the procedure including the risks, benefits and alternatives for the proposed anesthesia with the patient or authorized representative who has indicated his/her understanding and acceptance.     Dental advisory given  Plan Discussed with: CRNA and Surgeon  Anesthesia Plan Comments:         Anesthesia Quick Evaluation

## 2019-01-10 NOTE — H&P (Signed)
Logan Diaz is an 63 y.o. male.   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  Treatment options discussed with patient in detail he opted for surgical intervention.  All risks potential for explained in detail.  Past Medical History:  Diagnosis Date  . Blindness of left eye    SECONDARY TO A MAGNOLIA TREE PRICKLY BUD ACCIDENT AT AGE 61  . BPH (benign prostatic hyperplasia)   . Bradycardia   . Complication of anesthesia    Hard to wake up  . ED (erectile dysfunction)   . History of exercise intolerance dr Curt Bears   12-10-2017  normal exercise tolerence,  no evidence ishemia by ST segment analysis  . Hyperlipidemia   . Irregular heart beat   . LVH (left ventricular hypertrophy) 2019   Mild, noted on ECHO  . MR (mitral regurgitation) 2019   Mild, noted on ECHO  . OA (osteoarthritis)    LEFT KNEE  . PAC (premature atrial contraction)   . Pre-diabetes   . PVCs (premature ventricular contractions)    cardiology EP -- dr Curt Bears--  holter monitor w/  PVCs 26% beats couplets occasional NSVT only 3 beats  . Rotator cuff tear, right   . Seasonal allergies   . Skin cancer    top of head  . Spinal stenosis, cervical region    Mild  . Umbilical hernia   . Wears glasses     Past Surgical History:  Procedure Laterality Date  . ANTERIOR CERVICAL DECOMP/DISCECTOMY FUSION  07/2017   C6-7  . CARDIOVASCULAR STRESS TEST  11-15-2017   dr Johnsie Cancel   Low risk nuclear study w/ probable inferoseptal and apical thinning but no significant ischemia;  study no gated due to ectopy  . COLONOSCOPY    . EYE SURGERY Left x2 @ age 21   injury--  and  Catarat sx @ age 70 and 35 approx.  Marland Kitchen KNEE  ARTHROSCOPY Left 1980s  . KNEE ARTHROSCOPY Left 12/23/2017   Procedure: Left knee arthroscopy, debridement removal loose bodies, chondroplasty and partial lateral meniscectomy;  Surgeon: Sydnee Cabal, MD;  Location: [facility];  Service: Orthopedics;  Laterality: Left;  45 mins / to stay over night monitor floor cardiology [facility]  . [place] SURGERY  1990s  . TRANSTHORACIC ECHOCARDIOGRAM  11-15-2017   dr Johnsie Cancel   mild LVH, ef 55-60%/  mild MR, PR and TR  . UMBILICAL HERNIA REPAIR N/A 07/18/2018   Procedure: UMBILICAL HERNIA REPAIR;  Surgeon: Rolm Bookbinder, MD;  Location: [place];  Service: General;  Laterality: N/A;  GENERAL AND TAP BLOCK    Family History  Problem Relation Age of Onset  . Healthy Son   . Healthy Daughter   . Healthy Mother   . Healthy Father    Social History:  reports that he quit smoking about 44 years ago. His smoking use included cigarettes. He quit after 2.00 years of use. He has never used smokeless tobacco. He reports previous alcohol use. He reports that he does not use drugs.  Allergies: No Known Allergies  Medications Prior to Admission  Medication Sig Dispense Refill  . finasteride (PROSCAR) 5 MG tablet Take 5 mg by mouth every morning.   0  . flecainide (TAMBOCOR) 100 MG tablet  TAKE 1 TABLET(100 MG) BY MOUTH TWICE DAILY 60 tablet 6  . Glucos-Chond-Hyal Ac-Ca Fructo (MOVE FREE JOINT HEALTH ADVANCE PO) Take 1 tablet by mouth daily.    Marland Kitchen ibuprofen (ADVIL,MOTRIN) 200 MG tablet Take 3 tablets by mouth as needed for pain.    Marland Kitchen MAGNESIUM PO Take 400 mg by mouth daily. 1-2 TABLETS BY MOUTH DAILY     . Omega-3 Fatty Acids (FISH OIL PO) Take 500 mg by mouth daily.     . sildenafil (REVATIO) 20 MG tablet 20 mg. TAKE 2-5 TABLETS ONCE A DAY AS NEEDED , ONE HOUR PRIOR TO SEX     . tamsulosin (FLOMAX) 0.4 MG CAPS capsule Take 0.4 mg by mouth daily after breakfast.   0  . fluticasone (FLONASE) 50 MCG/ACT nasal spray Place 1 spray into both  nostrils daily as needed for allergies.     Marland Kitchen traMADol (ULTRAM) 50 MG tablet Take 2 tablets (100 mg total) by mouth every 6 (six) hours as needed. 10 tablet 0    Results for orders placed or performed during the hospital encounter of 01/10/19 (from the past 48 hour(s))  I-STAT 4, (NA,K, GLUC, HGB,HCT)     Status: Abnormal   Collection Time: 01/10/19 11:07 AM  Result Value Ref Range   Sodium 140 135 - 145 mmol/L   Potassium 4.6 3.5 - 5.1 mmol/L   Glucose, Bld 105 (H) 70 - 99 mg/dL   HCT 42.0 39.0 - 52.0 %   Hemoglobin 14.3 13.0 - 17.0 g/dL   No results found.  Review of Systems  Musculoskeletal: Positive for joint pain.  All other systems reviewed and are negative.   Blood pressure (!) 144/69, pulse 78, temperature 98 F (36.7 C), temperature source Oral, resp. rate 17, height 6\' 1"  (1.854 m), weight 105.5 kg, SpO2 97 %. Physical Exam  Constitutional: He is oriented to person, place, and time. He appears well-developed and well-nourished.  HENT:  Head: Normocephalic and atraumatic.  Eyes: Pupils are equal, round, and reactive to light. Conjunctivae are normal.  Neck: Normal range of motion. Neck supple.  Cardiovascular: Normal rate, regular rhythm and normal heart sounds.  Respiratory: Effort normal and breath sounds normal.  GI: Soft. Bowel sounds are normal.  Musculoskeletal:     Right shoulder: He exhibits tenderness, bony tenderness, pain and decreased strength. He exhibits normal range of motion.  Neurological: He is alert and oriented to person, place, and time.  Skin: Skin is warm and dry.  Psychiatric: He has a normal mood and affect. His behavior is normal. Judgment and thought content normal.     Assessment/Plan 63 year old male with right shoulder rotator cuff tear AC arthropathy and subluxed biceps tendon ready for surgical management.  Plan will be a right shoulder evaluation under anesthesia arthroscopic evaluation subacromial decompression distal clavicle  resection rotator cuff repair and biceps tenodesis all questions were encouraged and answered. Cynda Familia, MD 01/10/2019, 1:24 PM

## 2019-01-12 ENCOUNTER — Encounter (HOSPITAL_BASED_OUTPATIENT_CLINIC_OR_DEPARTMENT_OTHER): Payer: Self-pay | Admitting: Specialist

## 2019-01-18 DIAGNOSIS — Z4789 Encounter for other orthopedic aftercare: Secondary | ICD-10-CM | POA: Diagnosis not present

## 2019-01-18 DIAGNOSIS — M25611 Stiffness of right shoulder, not elsewhere classified: Secondary | ICD-10-CM | POA: Diagnosis not present

## 2019-01-25 DIAGNOSIS — M25611 Stiffness of right shoulder, not elsewhere classified: Secondary | ICD-10-CM | POA: Diagnosis not present

## 2019-02-01 DIAGNOSIS — M25511 Pain in right shoulder: Secondary | ICD-10-CM | POA: Diagnosis not present

## 2019-02-08 DIAGNOSIS — M25611 Stiffness of right shoulder, not elsewhere classified: Secondary | ICD-10-CM | POA: Diagnosis not present

## 2019-02-15 DIAGNOSIS — M25511 Pain in right shoulder: Secondary | ICD-10-CM | POA: Diagnosis not present

## 2019-03-01 DIAGNOSIS — M25511 Pain in right shoulder: Secondary | ICD-10-CM | POA: Diagnosis not present

## 2019-03-07 DIAGNOSIS — L570 Actinic keratosis: Secondary | ICD-10-CM | POA: Diagnosis not present

## 2019-03-07 DIAGNOSIS — M25611 Stiffness of right shoulder, not elsewhere classified: Secondary | ICD-10-CM | POA: Diagnosis not present

## 2019-03-07 DIAGNOSIS — M25511 Pain in right shoulder: Secondary | ICD-10-CM | POA: Diagnosis not present

## 2019-03-16 DIAGNOSIS — M25611 Stiffness of right shoulder, not elsewhere classified: Secondary | ICD-10-CM | POA: Diagnosis not present

## 2019-03-16 DIAGNOSIS — M25511 Pain in right shoulder: Secondary | ICD-10-CM | POA: Diagnosis not present

## 2019-03-22 DIAGNOSIS — M25611 Stiffness of right shoulder, not elsewhere classified: Secondary | ICD-10-CM | POA: Diagnosis not present

## 2019-03-22 DIAGNOSIS — M25511 Pain in right shoulder: Secondary | ICD-10-CM | POA: Diagnosis not present

## 2019-03-29 DIAGNOSIS — M25611 Stiffness of right shoulder, not elsewhere classified: Secondary | ICD-10-CM | POA: Diagnosis not present

## 2019-04-03 ENCOUNTER — Encounter (INDEPENDENT_AMBULATORY_CARE_PROVIDER_SITE_OTHER): Payer: Self-pay

## 2019-04-03 ENCOUNTER — Other Ambulatory Visit: Payer: Self-pay

## 2019-04-03 ENCOUNTER — Ambulatory Visit (INDEPENDENT_AMBULATORY_CARE_PROVIDER_SITE_OTHER): Payer: BC Managed Care – PPO | Admitting: Student

## 2019-04-03 VITALS — BP 148/86 | HR 61 | Ht 73.0 in | Wt 248.0 lb

## 2019-04-03 DIAGNOSIS — I493 Ventricular premature depolarization: Secondary | ICD-10-CM

## 2019-04-03 LAB — BASIC METABOLIC PANEL
BUN/Creatinine Ratio: 14 (ref 10–24)
BUN: 17 mg/dL (ref 8–27)
CO2: 23 mmol/L (ref 20–29)
Calcium: 9.6 mg/dL (ref 8.6–10.2)
Chloride: 106 mmol/L (ref 96–106)
Creatinine, Ser: 1.2 mg/dL (ref 0.76–1.27)
GFR calc Af Amer: 75 mL/min/{1.73_m2} (ref >59)
GFR calc non Af Amer: 65 mL/min/{1.73_m2} (ref >59)
Glucose: 110 mg/dL — ABNORMAL HIGH (ref 65–99)
Potassium: 5.2 mmol/L (ref 3.5–5.2)
Sodium: 142 mmol/L (ref 134–144)

## 2019-04-03 NOTE — Patient Instructions (Signed)
Medication Instructions:   Your physician recommends that you continue on your current medications as directed. Please refer to the Current Medication list given to you today.  If you need a refill on your cardiac medications before your next appointment, please call your pharmacy.   Lab work:  BMET TODAY    If you have labs (blood work) drawn today and your tests are completely normal, you will receive your results only by: Marland Kitchen MyChart Message (if you have MyChart) OR . A paper copy in the mail If you have any lab test that is abnormal or we need to change your treatment, we will call you to review the results.  Testing/Procedures:NONE ORDERED  TODAY    Follow-Up: At [facility], you and your health needs are our priority.  As part of our continuing mission to provide you with exceptional heart care, we have created designated Provider Care Teams.  These Care Teams include your primary Cardiologist (physician) and Advanced Practice Providers (APPs -  Physician Assistants and Nurse Practitioners) who all work together to provide you with the care you need, when you need it. You will need a follow up appointment in 6 months.  Please call our office 2 months in advance to schedule this appointment.  You may see Will Meredith Leeds, MD or one of the following Advanced Practice Providers on your designated Care Team:   Chanetta Marshall, NP \\Renee  Broughton, [place] Joesph July PA-C   Any Other Special Instructions Will Be Listed Below (If Applicable).

## 2019-04-03 NOTE — Progress Notes (Signed)
PCP:  Antony Contras, MD Primary Cardiologist: Jenkins Rouge, MD Electrophysiologist: Will Meredith Leeds, MD   Logan Diaz is a 63 y.o. male  With history of PVCs who presents today for routine electrophysiology followup. They are seen for Dr Curt Bears.   Since last being seen in our clinic, the patient reports doing very well.   He denies symptoms of palpitations, chest pain, shortness of breath, orthopnea, PND, lower extremity edema, claudication, dizziness, presyncope, syncope, bleeding, or neurologic sequela. The patient is tolerating medications without difficulties.     Past Medical History:  Diagnosis Date  . Blindness of left eye    SECONDARY TO A MAGNOLIA TREE PRICKLY BUD ACCIDENT AT AGE 37  . BPH (benign prostatic hyperplasia)   . Bradycardia   . Complication of anesthesia    Hard to wake up  . ED (erectile dysfunction)   . History of exercise intolerance dr Curt Bears   12-10-2017  normal exercise tolerence,  no evidence ishemia by ST segment analysis  . Hyperlipidemia   . Irregular heart beat   . LVH (left ventricular hypertrophy) 2019   Mild, noted on ECHO  . MR (mitral regurgitation) 2019   Mild, noted on ECHO  . OA (osteoarthritis)    LEFT KNEE  . PAC (premature atrial contraction)   . Pre-diabetes   . PVCs (premature ventricular contractions)    cardiology EP -- dr Curt Bears--  holter monitor w/  PVCs 26% beats couplets occasional NSVT only 3 beats  . Rotator cuff tear, right   . Seasonal allergies   . Skin cancer    top of head  . Spinal stenosis, cervical region    Mild  . Umbilical hernia   . Wears glasses    Past Surgical History:  Procedure Laterality Date  . ANTERIOR CERVICAL DECOMP/DISCECTOMY FUSION  07/2017   C6-7  . CARDIOVASCULAR STRESS TEST  11-15-2017   dr Johnsie Cancel   Low risk nuclear study w/ probable inferoseptal and apical thinning but no significant ischemia;  study no gated due to ectopy  . COLONOSCOPY    . EYE SURGERY Left x2 @ age 50   injury--  and  Catarat sx @ age 65 and 49 approx.  Marland Kitchen KNEE ARTHROSCOPY Left 1980s  . KNEE ARTHROSCOPY Left 12/23/2017   Procedure: Left knee arthroscopy, debridement removal loose bodies, chondroplasty and partial lateral meniscectomy;  Surgeon: Sydnee Cabal, MD;  Location: [facility];  Service: Orthopedics;  Laterality: Left;  45 mins / to stay over night monitor floor cardiology per [facility]  . [place] SURGERY  1990s  . SHOULDER ARTHROSCOPY WITH ROTATOR CUFF REPAIR Right 01/10/2019   Procedure: SHOULDER ARTHROSCOPY WITH ROTATOR CUFF REPAIR biceps tenodesis, subacromial decompression, distal calvicle excision;  Surgeon: Sydnee Cabal, MD;  Location: [facility];  Service: Orthopedics;  Laterality: Right;  Interscalene block  . TRANSTHORACIC ECHOCARDIOGRAM  11-15-2017   dr Johnsie Cancel   mild LVH, ef 55-60%/  mild MR, PR and TR  . UMBILICAL HERNIA REPAIR N/A 07/18/2018   Procedure: UMBILICAL HERNIA REPAIR;  Surgeon: Rolm Bookbinder, MD;  Location: [place];  Service: General;  Laterality: N/A;  GENERAL AND TAP BLOCK    Current Outpatient Medications  Medication Sig Dispense Refill  . finasteride (PROSCAR) 5 MG tablet Take 5 mg by mouth every morning.   0  . flecainide (TAMBOCOR) 100 MG tablet TAKE 1 TABLET(100 MG) BY MOUTH TWICE DAILY 60 tablet 6  . fluticasone (FLONASE) 50 MCG/ACT nasal spray Place  1 spray into both nostrils daily as needed for allergies.     . Glucos-Chond-Hyal Ac-Ca Fructo (MOVE FREE JOINT HEALTH ADVANCE PO) Take 1 tablet by mouth daily.    Marland Kitchen ibuprofen (ADVIL,MOTRIN) 200 MG tablet Take 3 tablets by mouth as needed for pain.    Marland Kitchen MAGNESIUM PO Take 400 mg by mouth daily. 1-2 TABLETS BY MOUTH DAILY     . Omega-3 Fatty Acids (FISH OIL PO) Take 500 mg by mouth daily.     . sildenafil (REVATIO) 20 MG tablet 20 mg. TAKE 2-5 TABLETS ONCE A DAY AS NEEDED , ONE HOUR PRIOR TO SEX     . tamsulosin (FLOMAX) 0.4 MG CAPS capsule Take 0.4 mg by mouth daily  after breakfast.   0   No current facility-administered medications for this visit.     No Known Allergies  Social History   Socioeconomic History  . Marital status: Married    Spouse name: Not on file  . Number of children: Not on file  . Years of education: Not on file  . Highest education level: Not on file  Occupational History  . Not on file  Social Needs  . Financial resource strain: Not on file  . Food insecurity    Worry: Not on file    Inability: Not on file  . Transportation needs    Medical: Not on file    Non-medical: Not on file  Tobacco Use  . Smoking status: Former Smoker    Years: 2.00    Types: Cigarettes    Quit date: 12/21/1974    Years since quitting: 44.3  . Smokeless tobacco: Never Used  Substance and Sexual Activity  . Alcohol use: Not Currently    Frequency: Never  . Drug use: Never  . Sexual activity: Not on file  Lifestyle  . Physical activity    Days per week: Not on file    Minutes per session: Not on file  . Stress: Not on file  Relationships  . Social Herbalist on phone: Not on file    Gets together: Not on file    Attends religious service: Not on file    Active member of club or organization: Not on file    Attends meetings of clubs or organizations: Not on file    Relationship status: Not on file  . Intimate partner violence    Fear of current or ex partner: Not on file    Emotionally abused: Not on file    Physically abused: Not on file    Forced sexual activity: Not on file  Other Topics Concern  . Not on file  Social History Narrative  . Not on file     Review of Systems: General: No chills, fever, night sweats or weight changes  Cardiovascular:  No chest pain, dyspnea on exertion, edema, orthopnea, palpitations, paroxysmal nocturnal dyspnea Dermatological: No rash, lesions or masses Respiratory: No cough, dyspnea Urologic: No hematuria, dysuria Abdominal: No nausea, vomiting, diarrhea, bright red blood  per rectum, melena, or hematemesis Neurologic: No visual changes, weakness, changes in mental status  All other systems reviewed and are otherwise negative except as noted above.  Physical Exam: Vitals:   04/03/19 0902  BP: (!) 148/86  Pulse: 61  Weight: 248 lb (112.5 kg)  Height: 6\' 1"  (1.854 m)    GEN- The patient is well appearing, alert and oriented x 3 today.   HEENT: normocephalic, atraumatic; sclera clear, conjunctiva pink; hearing intact;  oropharynx clear; neck supple, no JVP Lymph- no cervical lymphadenopathy Lungs- Clear to ausculation bilaterally, normal work of breathing.  No wheezes, rales, rhonchi Heart- Regular rate and rhythm, no murmurs, rubs or gallops, PMI not laterally displaced GI- soft, non-tender, non-distended, bowel sounds present, no hepatosplenomegaly Extremities- no clubbing or cyanosis; DP/PT/radial pulses 2+ bilaterally. Trace ankle edema.  MS- no significant deformity or atrophy Skin- warm and dry, no rash or lesion Psych- euthymic mood, full affect Neuro- strength and sensation are intact  EKG is ordered today. Personal review shows NSR at 61 bpm, QRS 118 ms, PR interval 176 ms.  Assessment and Plan: 1. PVCs Stable on flecainide 100 mg BID. Continue at current dose.  EKG today reviewed, BMET/Mg today.  With no symptoms, consider updating Echo next year (q 2 years), Sooner with symptoms  2. HTN He states runs in 120s or less at home. He is going to follow and call us back if BP runs 140 at home with any regularity.   Shirley Friar, PA-C  04/03/19 9:16 AM   Greater than 50% of the 25 minute visit was spent in counseling/coordination of care regarding disease state education and medication compliance.

## 2019-04-06 DIAGNOSIS — M25611 Stiffness of right shoulder, not elsewhere classified: Secondary | ICD-10-CM | POA: Diagnosis not present

## 2019-04-12 DIAGNOSIS — M25611 Stiffness of right shoulder, not elsewhere classified: Secondary | ICD-10-CM | POA: Diagnosis not present

## 2019-04-12 DIAGNOSIS — M25511 Pain in right shoulder: Secondary | ICD-10-CM | POA: Diagnosis not present

## 2019-04-19 DIAGNOSIS — Z4789 Encounter for other orthopedic aftercare: Secondary | ICD-10-CM | POA: Diagnosis not present

## 2019-04-19 DIAGNOSIS — M25512 Pain in left shoulder: Secondary | ICD-10-CM | POA: Diagnosis not present

## 2019-04-20 DIAGNOSIS — M25611 Stiffness of right shoulder, not elsewhere classified: Secondary | ICD-10-CM | POA: Diagnosis not present

## 2019-04-26 DIAGNOSIS — M25611 Stiffness of right shoulder, not elsewhere classified: Secondary | ICD-10-CM | POA: Diagnosis not present

## 2019-05-01 DIAGNOSIS — N529 Male erectile dysfunction, unspecified: Secondary | ICD-10-CM | POA: Diagnosis not present

## 2019-05-01 DIAGNOSIS — Z0000 Encounter for general adult medical examination without abnormal findings: Secondary | ICD-10-CM | POA: Diagnosis not present

## 2019-05-01 DIAGNOSIS — I493 Ventricular premature depolarization: Secondary | ICD-10-CM | POA: Diagnosis not present

## 2019-05-01 DIAGNOSIS — E7800 Pure hypercholesterolemia, unspecified: Secondary | ICD-10-CM | POA: Diagnosis not present

## 2019-05-01 DIAGNOSIS — R7303 Prediabetes: Secondary | ICD-10-CM | POA: Diagnosis not present

## 2019-05-02 DIAGNOSIS — E7800 Pure hypercholesterolemia, unspecified: Secondary | ICD-10-CM | POA: Diagnosis not present

## 2019-05-02 DIAGNOSIS — R7303 Prediabetes: Secondary | ICD-10-CM | POA: Diagnosis not present

## 2019-05-02 DIAGNOSIS — Z125 Encounter for screening for malignant neoplasm of prostate: Secondary | ICD-10-CM | POA: Diagnosis not present

## 2019-05-03 DIAGNOSIS — M25611 Stiffness of right shoulder, not elsewhere classified: Secondary | ICD-10-CM | POA: Diagnosis not present

## 2019-05-10 DIAGNOSIS — M25611 Stiffness of right shoulder, not elsewhere classified: Secondary | ICD-10-CM | POA: Diagnosis not present

## 2019-05-17 DIAGNOSIS — M25511 Pain in right shoulder: Secondary | ICD-10-CM | POA: Diagnosis not present

## 2019-05-24 DIAGNOSIS — M25611 Stiffness of right shoulder, not elsewhere classified: Secondary | ICD-10-CM | POA: Diagnosis not present

## 2019-05-29 DIAGNOSIS — M25511 Pain in right shoulder: Secondary | ICD-10-CM | POA: Diagnosis not present

## 2019-05-29 DIAGNOSIS — M25611 Stiffness of right shoulder, not elsewhere classified: Secondary | ICD-10-CM | POA: Diagnosis not present

## 2019-05-31 DIAGNOSIS — M25561 Pain in right knee: Secondary | ICD-10-CM | POA: Diagnosis not present

## 2019-06-19 DIAGNOSIS — M25562 Pain in left knee: Secondary | ICD-10-CM | POA: Diagnosis not present

## 2019-06-19 DIAGNOSIS — M25561 Pain in right knee: Secondary | ICD-10-CM | POA: Diagnosis not present

## 2019-06-19 DIAGNOSIS — M170 Bilateral primary osteoarthritis of knee: Secondary | ICD-10-CM | POA: Diagnosis not present

## 2019-06-30 DIAGNOSIS — Z1211 Encounter for screening for malignant neoplasm of colon: Secondary | ICD-10-CM | POA: Diagnosis not present

## 2019-06-30 DIAGNOSIS — K6289 Other specified diseases of anus and rectum: Secondary | ICD-10-CM | POA: Diagnosis not present

## 2019-07-12 DIAGNOSIS — Z9889 Other specified postprocedural states: Secondary | ICD-10-CM | POA: Diagnosis not present

## 2019-08-14 ENCOUNTER — Other Ambulatory Visit: Payer: Self-pay | Admitting: Student

## 2019-08-14 MED ORDER — FLECAINIDE ACETATE 100 MG PO TABS: ORAL_TABLET | ORAL | 2 refills | Status: DC

## 2019-08-14 NOTE — Telephone Encounter (Signed)
Pt's medication was sent to pt's pharmacy as requested. Confirmation received.  °

## 2019-08-28 DIAGNOSIS — Z125 Encounter for screening for malignant neoplasm of prostate: Secondary | ICD-10-CM | POA: Diagnosis not present

## 2019-08-28 DIAGNOSIS — R3914 Feeling of incomplete bladder emptying: Secondary | ICD-10-CM | POA: Diagnosis not present

## 2019-09-03 DIAGNOSIS — Z125 Encounter for screening for malignant neoplasm of prostate: Secondary | ICD-10-CM | POA: Diagnosis not present

## 2019-09-03 DIAGNOSIS — R3914 Feeling of incomplete bladder emptying: Secondary | ICD-10-CM | POA: Diagnosis not present

## 2019-09-05 DIAGNOSIS — R3914 Feeling of incomplete bladder emptying: Secondary | ICD-10-CM | POA: Diagnosis not present

## 2019-09-05 DIAGNOSIS — N5201 Erectile dysfunction due to arterial insufficiency: Secondary | ICD-10-CM | POA: Diagnosis not present

## 2019-09-05 DIAGNOSIS — Z125 Encounter for screening for malignant neoplasm of prostate: Secondary | ICD-10-CM | POA: Diagnosis not present

## 2019-09-12 DIAGNOSIS — H40013 Open angle with borderline findings, low risk, bilateral: Secondary | ICD-10-CM | POA: Diagnosis not present

## 2019-10-01 NOTE — Progress Notes (Signed)
Cardiology Office Note Date:  10/02/2019  Patient ID:  Logan Diaz, Logan Diaz 1956-03-03, MRN [id number] PCP:  Antony Contras, MD  Cardiologist:  Dr. Johnsie Cancel Electrophysiologist: Dr. Curt Bears    Chief Complaint: 6 mo visit  History of Present Illness: Logan Diaz is a 64 y.o. male with history of HLD, HTN, blind L eye (childhood accident), PVCs  He comes in today to be seen for Dr. Curt Bears, last seen by EP service with [place], [place] Sept 2020.  AT that time doing well, had no complaints. His BP was high, though reported better at home.  No changes were made to his regime.  He feels well.  Denies any kind of cardiac awareness (never did feel his PVCs), no CP, palpitations.  No SOB, DOE.  No exertional intolerances. 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 No dizzy spells, near syncope or syncope.  He inquires about OTC testosterone replacement type products reports some generalized lack of energy.  Reports his testosterone measured low end of normal range, and he suspects this as the culprit.  He is not physically limited.  his PMD did not prescribe anything.  And asked he inquire with [name].    Past Medical History:  Diagnosis Date  . Blindness of left eye    SECONDARY TO A MAGNOLIA TREE PRICKLY BUD ACCIDENT AT AGE 81  . BPH (benign prostatic hyperplasia)   . Bradycardia   . Complication of anesthesia    Hard to wake up  . ED (erectile dysfunction)   . History of exercise intolerance dr Curt Bears   12-10-2017  normal exercise tolerence,  no evidence ishemia by ST segment analysis  . Hyperlipidemia   . Irregular heart beat   . LVH (left ventricular hypertrophy) 2019   Mild, noted on ECHO  . MR (mitral regurgitation) 2019   Mild, noted on ECHO  . OA (osteoarthritis)    LEFT KNEE  . PAC (premature atrial contraction)   . Pre-diabetes   . PVCs (premature ventricular contractions)    cardiology EP -- dr Curt Bears--  holter monitor w/  PVCs 26% beats couplets  occasional NSVT only 3 beats  . Rotator cuff tear, right   . Seasonal allergies   . Skin cancer    top of head  . Spinal stenosis, cervical region    Mild  . Umbilical hernia   . Wears glasses     Past Surgical History:  Procedure Laterality Date  . ANTERIOR CERVICAL DECOMP/DISCECTOMY FUSION  07/2017   C6-7  . CARDIOVASCULAR STRESS TEST  11-15-2017   dr Johnsie Cancel   Low risk nuclear study w/ probable inferoseptal and apical thinning but no significant ischemia;  study no gated due to ectopy  . COLONOSCOPY    . EYE SURGERY Left x2 @ age 69   injury--  and  Catarat sx @ age 28 and 62 approx.  Marland Kitchen KNEE ARTHROSCOPY Left 1980s  . KNEE ARTHROSCOPY Left 12/23/2017   Procedure: Left knee arthroscopy, debridement removal loose bodies, chondroplasty and partial lateral meniscectomy;  Surgeon: Sydnee Cabal, MD;  Location: [facility];  Service: Orthopedics;  Laterality: Left;  45 mins / to stay over night monitor floor cardiology per [facility]  . [place] SURGERY  1990s  . SHOULDER ARTHROSCOPY WITH ROTATOR CUFF REPAIR Right 01/10/2019   Procedure: SHOULDER ARTHROSCOPY WITH ROTATOR CUFF REPAIR biceps tenodesis, subacromial decompression, distal calvicle excision;  Surgeon: Sydnee Cabal, MD;  Location: [facility];  Service: Orthopedics;  Laterality: Right;  Interscalene block  . TRANSTHORACIC ECHOCARDIOGRAM  11-15-2017   dr Johnsie Cancel   mild LVH, ef 55-60%/  mild MR, PR and TR  . UMBILICAL HERNIA REPAIR N/A 07/18/2018   Procedure: UMBILICAL HERNIA REPAIR;  Surgeon: Rolm Bookbinder, MD;  Location: [place];  Service: General;  Laterality: N/A;  GENERAL AND TAP BLOCK    Current Outpatient Medications  Medication Sig Dispense Refill  . finasteride (PROSCAR) 5 MG tablet Take 5 mg by mouth every morning.   0  . flecainide (TAMBOCOR) 100 MG tablet TAKE 1 TABLET(100 MG) BY MOUTH TWICE DAILY 180 tablet 2  . fluticasone (FLONASE) 50 MCG/ACT nasal spray Place 1 spray into both  nostrils daily as needed for allergies.     . Glucos-Chond-Hyal Ac-Ca Fructo (MOVE FREE JOINT HEALTH ADVANCE PO) Take 1 tablet by mouth daily.    Marland Kitchen ibuprofen (ADVIL,MOTRIN) 200 MG tablet Take 3 tablets by mouth as needed for pain.    Marland Kitchen MAGNESIUM PO Take 400 mg by mouth daily. 1-2 TABLETS BY MOUTH DAILY     . Omega-3 Fatty Acids (FISH OIL PO) Take 500 mg by mouth daily.     . sildenafil (REVATIO) 20 MG tablet 20 mg. TAKE 2-5 TABLETS ONCE A DAY AS NEEDED , ONE HOUR PRIOR TO SEX     . tamsulosin (FLOMAX) 0.4 MG CAPS capsule Take 0.4 mg by mouth daily after breakfast.   0   No current facility-administered medications for this visit.    Allergies:   Patient has no known allergies.   Social History:  The patient  reports that he quit smoking about 44 years ago. His smoking use included cigarettes. He quit after 2.00 years of use. He has never used smokeless tobacco. He reports previous alcohol use. He reports that he does not use drugs.   Family History:  The patient's family history includes Healthy in his daughter, father, mother, and son.  ROS:  Please see the history of present illness.  All other systems are reviewed and otherwise negative.   PHYSICAL EXAM:  VS:  BP 124/76   Pulse 67   Ht 6\' 1"  (1.854 m)   Wt 247 lb (112 kg)   BMI 32.59 kg/m  BMI: Body mass index is 32.59 kg/m. Well nourished, well developed, in no acute distress  HEENT: normocephalic, atraumatic  Neck: no JVD, carotid bruits or masses Cardiac:  RRR; no significant murmurs, no rubs, or gallops Lungs:  CTA b/l, no wheezing, rhonchi or rales  Abd: soft, nontender MS: no deformity or atrophy Ext: no edema  Skin: warm and dry, no rash Neuro:  No gross deficits appreciated Psych: euthymic mood, full affect    EKG:  Done today and reviewed by myself:  SR 67bpm, stable intervals   11/15/2017: TTE Study Conclusions  - Left ventricle: The cavity size was normal. Wall thickness was  increased in a pattern of  mild LVH. Systolic function was normal.  The estimated ejection fraction was in the range of 55% to 60%.  Wall motion was normal; there were no regional wall motion  abnormalities. Left ventricular diastolic function parameters  were normal.  - Mitral valve: There was mild regurgitation.  - Atrial septum: No defect or patent foramen ovale was identified.    12/11/2019: EST: (on flecainide)  Blood pressure demonstrated a hypertensive response to exercise.  There was no ST segment deviation noted during stress.   1. Normal exercise tolerance.  2. No evidence for ischemia by  ST segment analysis.    May 2019: 48hr Holter NSR PVC;s 26% of beats couplets occasional NSVT only 3 beats  PAC;s  Not a candidate for beta blocker due to resting bradycardia F/u with EP to consider AAT to suppress  Recent Labs: 01/10/2019: Hemoglobin 14.3 04/03/2019: BUN 17; Creatinine, Ser 1.20; Potassium 5.2; Sodium 142  No results found for requested labs within last 8760 hours.   CrCl cannot be calculated (Patient's most recent lab result is older than the maximum 21 days allowed.).   Wt Readings from Last 3 Encounters:  10/02/19 247 lb (112 kg)  04/03/19 248 lb (112.5 kg)  01/10/19 232 lb 8 oz (105.5 kg)     Other studies reviewed: Additional studies/records reviewed today include: summarized above  ASSESSMENT AND PLAN:  1. PVCs     On flecainide     Not felt a candidate for nodal blocking agent with baseline bradycardia     Stable intervals  2. HTN ?     Not on any agent     BP looks ok   3. HLD     Monitored and managed by his PMD   4. Generalized lack of energy     He does not describe any kind of physical limitations Advised he start with a sleep diary and make sure he is getting adequate/quality sleep. He thinks he snores minimally, does not think he has apnea.   We discussed OTC men's health/testosterone type replacement products.  Advised using nothing that has any  kind of stimulant in it, should he decide to pursue this to call our pharmacist or his pharmacist that it contains no stimulants and is OK with his flecainide. And follow up with his PMD    Disposition: F/u with [name] in 6 mo, sooner if needed  Current medicines are reviewed at length with the patient today.  The patient did not have any concerns regarding medicines.  Venetia Night, PA-C 10/02/2019 8:27 AM     [address] [place] [address] [phone] (office)  [phone] (fax)

## 2019-10-02 ENCOUNTER — Encounter (INDEPENDENT_AMBULATORY_CARE_PROVIDER_SITE_OTHER): Payer: Self-pay

## 2019-10-02 ENCOUNTER — Ambulatory Visit (INDEPENDENT_AMBULATORY_CARE_PROVIDER_SITE_OTHER): Payer: BC Managed Care – PPO | Admitting: Physician Assistant

## 2019-10-02 ENCOUNTER — Other Ambulatory Visit: Payer: Self-pay

## 2019-10-02 VITALS — BP 124/76 | HR 67 | Ht 73.0 in | Wt 247.0 lb

## 2019-10-02 DIAGNOSIS — E7849 Other hyperlipidemia: Secondary | ICD-10-CM | POA: Diagnosis not present

## 2019-10-02 DIAGNOSIS — Z79899 Other long term (current) drug therapy: Secondary | ICD-10-CM

## 2019-10-02 DIAGNOSIS — I493 Ventricular premature depolarization: Secondary | ICD-10-CM | POA: Diagnosis not present

## 2019-10-02 NOTE — Patient Instructions (Addendum)
Medication Instructions:  Your physician recommends that you continue on your current medications as directed. Please refer to the Current Medication list given to you today.  *If you need a refill on your cardiac medications before your next appointment, please call your pharmacy*   Lab Work: NONE ORDERED  TODAY   If you have labs (blood work) drawn today and your tests are completely normal, you will receive your results only by: . MyChart Message (if you have MyChart) OR . A paper copy in the mail If you have any lab test that is abnormal or we need to change your treatment, we will call you to review the results.   Testing/Procedures: NONE ORDERED  TODAY   Follow-Up: At [facility], you and your health needs are our priority.  As part of our continuing mission to provide you with exceptional heart care, we have created designated Provider Care Teams.  These Care Teams include your primary Cardiologist (physician) and Advanced Practice Providers (APPs -  Physician Assistants and Nurse Practitioners) who all work together to provide you with the care you need, when you need it.  We recommend signing up for the patient portal called "MyChart".  Sign up information is provided on this After Visit Summary.  MyChart is used to connect with patients for Virtual Visits (Telemedicine).  Patients are able to view lab/test results, encounter notes, upcoming appointments, etc.  Non-urgent messages can be sent to your provider as well.   To learn more about what you can do with MyChart, go to [email or web address].    Your next appointment:   6 month(s)  The format for your next appointment:   In Person  Provider:   You may see Will Martin Camnitz, MD or one of the following Advanced Practice Providers on your designated Care Team:    Amber Seiler, NP  Renee Ursuy, PA-C  Michael "Andy" Tillery, PA-C    Other Instructions   

## 2020-01-16 DIAGNOSIS — M25512 Pain in left shoulder: Secondary | ICD-10-CM | POA: Diagnosis not present

## 2020-01-16 DIAGNOSIS — M75102 Unspecified rotator cuff tear or rupture of left shoulder, not specified as traumatic: Secondary | ICD-10-CM | POA: Diagnosis not present

## 2020-01-16 DIAGNOSIS — M19019 Primary osteoarthritis, unspecified shoulder: Secondary | ICD-10-CM | POA: Diagnosis not present

## 2020-01-27 DIAGNOSIS — M25512 Pain in left shoulder: Secondary | ICD-10-CM | POA: Diagnosis not present

## 2020-02-14 DIAGNOSIS — M19012 Primary osteoarthritis, left shoulder: Secondary | ICD-10-CM | POA: Diagnosis not present

## 2020-02-14 DIAGNOSIS — M75121 Complete rotator cuff tear or rupture of right shoulder, not specified as traumatic: Secondary | ICD-10-CM | POA: Diagnosis not present

## 2020-03-29 NOTE — Progress Notes (Deleted)
Cardiology Office Note Date:  03/29/2020  Patient ID:  Logan, Diaz 01-16-1956, MRN [id number] PCP:  Antony Contras, MD  Cardiologist:  Dr. Johnsie Cancel Electrophysiologist: Dr. Curt Bears    Chief Complaint:  *** 6 mo visit  History of Present Illness: Logan Diaz is a 64 y.o. male with history of HLD, HTN, blind L eye (childhood accident), PVCs  He comes in today to be seen for Dr. Curt Bears, last seen by EP service with [place]. [place], [place] Sept 2020.  At that time doing well, had no complaints. His BP was high, though reported better at home.  No changes were made to his regime.  I saw him 10/02/19 He feels well.  Denies any kind of cardiac awareness (never did feel his PVCs), no CP, palpitations.  No SOB, DOE.  No exertional intolerances. 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 No dizzy spells, near syncope or syncope. He inquires about OTC testosterone replacement type products reports some generalized lack of energy.  Reports his testosterone measured low end of normal range, and he suspects this as the culprit.  He is not physically limited. his PMD did not prescribe anything.  And asked he inquire with [name]. He was advised to keep a sleep diary, discussed importance of adequate sleep, advised against using any products with stimulants in it and to confer with his pharmacist prior to taking any products prescribed or OTC that is is ok with his medicines, Flecainide.  *** symptoms *** fatigue *** flecainide EKG *** labs, lipids    Past Medical History:  Diagnosis Date  . 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 BPH (benign prostatic hyperplasia)   . Bradycardia   . Complication of anesthesia    Hard to wake up  . ED (erectile dysfunction)   . History of exercise intolerance dr Curt Bears   12-10-2017  normal exercise tolerence,  no evidence ishemia by ST segment analysis  . Hyperlipidemia   . Irregular heart beat    . LVH (left ventricular hypertrophy) 2019   Mild, noted on ECHO  . MR (mitral regurgitation) 2019   Mild, noted on ECHO  . OA (osteoarthritis)    LEFT KNEE  . PAC (premature atrial contraction)   . Pre-diabetes   . PVCs (premature ventricular contractions)    cardiology EP -- dr Curt Bears--  holter monitor w/  PVCs 26% beats couplets occasional NSVT only 3 beats  . Rotator cuff tear, right   . Seasonal allergies   . Skin cancer    top of head  . Spinal stenosis, cervical region    Mild  . Umbilical hernia   . Wears glasses     Past Surgical History:  Procedure Laterality Date  . ANTERIOR CERVICAL DECOMP/DISCECTOMY FUSION  07/2017   C6-7  . CARDIOVASCULAR STRESS TEST  11-15-2017   dr Johnsie Cancel   Low risk nuclear study w/ probable inferoseptal and apical thinning but no significant ischemia;  study no gated due to ectopy  . COLONOSCOPY    . EYE SURGERY Left x2 @ age 53   injury--  and  Catarat sx @ age 54 and 18 approx.  Marland Kitchen KNEE ARTHROSCOPY Left 1980s  . KNEE ARTHROSCOPY Left 12/23/2017   Procedure: Left knee arthroscopy, debridement removal loose bodies, chondroplasty and partial lateral meniscectomy;  Surgeon: Sydnee Cabal, MD;  Location: [facility];  Service: Orthopedics;  Laterality: Left;  45  mins / to stay over night monitor floor cardiology per [facility]  . [place] SURGERY  1990s  . SHOULDER ARTHROSCOPY WITH ROTATOR CUFF REPAIR Right 01/10/2019   Procedure: SHOULDER ARTHROSCOPY WITH ROTATOR CUFF REPAIR biceps tenodesis, subacromial decompression, distal calvicle excision;  Surgeon: Sydnee Cabal, MD;  Location: [facility];  Service: Orthopedics;  Laterality: Right;  Interscalene block  . TRANSTHORACIC ECHOCARDIOGRAM  11-15-2017   dr Johnsie Cancel   mild LVH, ef 55-60%/  mild MR, PR and TR  . UMBILICAL HERNIA REPAIR N/A 07/18/2018   Procedure: UMBILICAL HERNIA REPAIR;  Surgeon: Rolm Bookbinder, MD;  Location: [place];  Service: General;   Laterality: N/A;  GENERAL AND TAP BLOCK    Current Outpatient Medications  Medication Sig Dispense Refill  . finasteride (PROSCAR) 5 MG tablet Take 5 mg by mouth every morning.   0  . flecainide (TAMBOCOR) 100 MG tablet TAKE 1 TABLET(100 MG) BY MOUTH TWICE DAILY 180 tablet 2  . fluticasone (FLONASE) 50 MCG/ACT nasal spray Place 1 spray into both nostrils daily as needed for allergies.     . Glucos-Chond-Hyal Ac-Ca Fructo (MOVE FREE JOINT HEALTH ADVANCE PO) Take 1 tablet by mouth daily.    Marland Kitchen ibuprofen (ADVIL,MOTRIN) 200 MG tablet Take 3 tablets by mouth as needed for pain.    Marland Kitchen MAGNESIUM PO Take 400 mg by mouth daily. 1-2 TABLETS BY MOUTH DAILY     . Omega-3 Fatty Acids (FISH OIL PO) Take 500 mg by mouth daily.     . sildenafil (REVATIO) 20 MG tablet 20 mg. TAKE 2-5 TABLETS ONCE A DAY AS NEEDED , ONE HOUR PRIOR TO SEX     . tamsulosin (FLOMAX) 0.4 MG CAPS capsule Take 0.4 mg by mouth daily after breakfast.   0   No current facility-administered medications for this visit.    Allergies:   Patient has no known allergies.   Social History:  The patient  reports that he quit smoking about 45 years ago. His smoking use included cigarettes. He quit after 2.00 years of use. He has never used smokeless tobacco. He reports previous alcohol use. He reports that he does not use drugs.   Family History:  The patient's family history includes Healthy in his daughter, father, mother, and son.  ROS:  Please see the history of present illness.  All other systems are reviewed and otherwise negative.   PHYSICAL EXAM:  VS:  There were no vitals taken for this visit. BMI: There is no height or weight on file to calculate BMI. Well nourished, well developed, in no acute distress  HEENT: normocephalic, atraumatic  Neck: no JVD, carotid bruits or masses Cardiac:  *** RRR; no significant murmurs, no rubs, or gallops Lungs: ***  CTA b/l, no wheezing, rhonchi or rales  Abd: soft, nontender MS: no deformity  or *** atrophy Ext: *** no edema  Skin: warm and dry, no rash Neuro:  No gross deficits appreciated Psych: euthymic mood, full affect    EKG:  Done today and reviewed by myself:  *** 10/02/2019: SR 67bpm, stable intervals   11/15/2017: TTE Study Conclusions  - Left ventricle: The cavity size was normal. Wall thickness was  increased in a pattern of mild LVH. Systolic function was normal.  The estimated ejection fraction was in the range of 55% to 60%.  Wall motion was normal; there were no regional wall motion  abnormalities. Left ventricular diastolic function parameters  were normal.  - Mitral valve: There was mild  regurgitation.  - Atrial septum: No defect or patent foramen ovale was identified.    12/11/2019: EST: (on flecainide)  Blood pressure demonstrated a hypertensive response to exercise.  There was no ST segment deviation noted during stress.   1. Normal exercise tolerance.  2. No evidence for ischemia by ST segment analysis.    May 2019: 48hr Holter NSR PVC;s 26% of beats couplets occasional NSVT only 3 beats  PAC;s  Not a candidate for beta blocker due to resting bradycardia F/u with EP to consider AAT to suppress  Recent Labs: 04/03/2019: BUN 17; Creatinine, Ser 1.20; Potassium 5.2; Sodium 142  No results found for requested labs within last 8760 hours.   CrCl cannot be calculated (Patient's most recent lab result is older than the maximum 21 days allowed.).   Wt Readings from Last 3 Encounters:  10/02/19 247 lb (112 kg)  04/03/19 248 lb (112.5 kg)  01/10/19 232 lb 8 oz (105.5 kg)     Other studies reviewed: Additional studies/records reviewed today include: summarized above  ASSESSMENT AND PLAN:  1. PVCs     *** On flecainide     Not felt a candidate for nodal blocking agent with baseline bradycardia     *** Stable intervals  2. HTN ?     Not on any agent     BP looks ok   3. HLD     *** Monitored and managed by his  PMD   4. Generalized lack of energy      ***    Disposition:***   Current medicines are reviewed at length with the patient today.  The patient did not have any concerns regarding medicines.  Venetia Night, PA-C 03/29/2020 7:52 AM     [facility] [place] [address] [phone] (office)  [phone] (fax)

## 2020-04-01 ENCOUNTER — Ambulatory Visit: Payer: BC Managed Care – PPO | Admitting: Physician Assistant

## 2020-04-02 DIAGNOSIS — Z1152 Encounter for screening for COVID-19: Secondary | ICD-10-CM | POA: Diagnosis not present

## 2020-04-05 ENCOUNTER — Ambulatory Visit (INDEPENDENT_AMBULATORY_CARE_PROVIDER_SITE_OTHER): Payer: BC Managed Care – PPO | Admitting: Physician Assistant

## 2020-04-05 ENCOUNTER — Other Ambulatory Visit: Payer: Self-pay

## 2020-04-05 VITALS — BP 128/72 | HR 87 | Ht 73.0 in | Wt 233.0 lb

## 2020-04-05 DIAGNOSIS — I493 Ventricular premature depolarization: Secondary | ICD-10-CM | POA: Diagnosis not present

## 2020-04-05 DIAGNOSIS — Z79899 Other long term (current) drug therapy: Secondary | ICD-10-CM | POA: Diagnosis not present

## 2020-04-05 DIAGNOSIS — Z5181 Encounter for therapeutic drug level monitoring: Secondary | ICD-10-CM

## 2020-04-05 NOTE — Progress Notes (Signed)
Cardiology Office Note Date:  04/05/2020  Patient ID:  Logan Diaz, Logan Diaz 1955-09-01, MRN [id number] PCP:  Antony Contras, MD  Cardiologist:  Dr. Johnsie Cancel Electrophysiologist: Dr. Curt Bears    Chief Complaint:  6 mo visit  History of Present Illness: Logan Diaz is a 64 y.o. male with history of HLD, HTN, blind L eye (childhood accident), PVCs  He comes in today to be seen for Dr. Curt Bears, last seen by EP service with A. [place], [place] Sept 2020.  At that time doing well, had no complaints. His BP was high, though reported better at home.  No changes were made to his regime.  I saw him 10/02/19 He feels well.  Denies any kind of cardiac awareness (never did feel his PVCs), no CP, palpitations.  No SOB, DOE.  No exertional intolerances. 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 No dizzy spells, near syncope or syncope. He inquires about OTC testosterone replacement type products reports some generalized lack of energy.  Reports his testosterone measured low end of normal range, and he suspects this as the culprit.  He is not physically limited. his PMD did not prescribe anything.  And asked he inquire with [name]. He was advised to keep a sleep diary, discussed importance of adequate sleep, advised against using any products with stimulants in it and to confer with his pharmacist prior to taking any products prescribed or OTC that is is ok with his medicines, Flecainide.  TODAY He reports feeling very well.  Opted not to pursue testosterone replacement. 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 His PVCs are very well controlled, states even if a couple hours late he will start to feel some PVCs and shortly after taking his flecainide, they simmer right down. He is very happy with his current regime. No dizzy spells, near syncope or syncope. PMD does annual labs    Past Medical History:  Diagnosis Date  . Blindness of left eye     SECONDARY TO A MAGNOLIA TREE PRICKLY BUD ACCIDENT AT AGE 64  . BPH (benign prostatic hyperplasia)   . Bradycardia   . Complication of anesthesia    Hard to wake up  . ED (erectile dysfunction)   . History of exercise intolerance dr Curt Bears   12-10-2017  normal exercise tolerence,  no evidence ishemia by ST segment analysis  . Hyperlipidemia   . Irregular heart beat   . LVH (left ventricular hypertrophy) 2019   Mild, noted on ECHO  . MR (mitral regurgitation) 2019   Mild, noted on ECHO  . OA (osteoarthritis)    LEFT KNEE  . PAC (premature atrial contraction)   . Pre-diabetes   . PVCs (premature ventricular contractions)    cardiology EP -- dr Curt Bears--  holter monitor w/  PVCs 26% beats couplets occasional NSVT only 3 beats  . Rotator cuff tear, right   . Seasonal allergies   . Skin cancer    top of head  . Spinal stenosis, cervical region    Mild  . Umbilical hernia   . Wears glasses     Past Surgical History:  Procedure Laterality Date  . ANTERIOR CERVICAL DECOMP/DISCECTOMY FUSION  07/2017   C6-7  . CARDIOVASCULAR STRESS TEST  11-15-2017   dr Johnsie Cancel   Low risk nuclear study w/ probable inferoseptal and apical thinning but no significant ischemia;  study no gated due to ectopy  . COLONOSCOPY    . EYE SURGERY Left x2 @  age 65   injury--  and  Catarat sx @ age 41 and 73 approx.  Logan Diaz KNEE ARTHROSCOPY Left 1980s  . KNEE ARTHROSCOPY Left 12/23/2017   Procedure: Left knee arthroscopy, debridement removal loose bodies, chondroplasty and partial lateral meniscectomy;  Surgeon: Sydnee Cabal, MD;  Location: [facility];  Service: Orthopedics;  Laterality: Left;  45 mins / to stay over night monitor floor cardiology per [facility]  . [place] SURGERY  1990s  . SHOULDER ARTHROSCOPY WITH ROTATOR CUFF REPAIR Right 01/10/2019   Procedure: SHOULDER ARTHROSCOPY WITH ROTATOR CUFF REPAIR biceps tenodesis, subacromial decompression, distal calvicle excision;  Surgeon: Sydnee Cabal, MD;  Location: [facility];  Service: Orthopedics;  Laterality: Right;  Interscalene block  . TRANSTHORACIC ECHOCARDIOGRAM  11-15-2017   dr Johnsie Cancel   mild LVH, ef 55-60%/  mild MR, PR and TR  . UMBILICAL HERNIA REPAIR N/A 07/18/2018   Procedure: UMBILICAL HERNIA REPAIR;  Surgeon: Rolm Bookbinder, MD;  Location: [place];  Service: General;  Laterality: N/A;  GENERAL AND TAP BLOCK    Current Outpatient Medications  Medication Sig Dispense Refill  . finasteride (PROSCAR) 5 MG tablet Take 5 mg by mouth every morning.   0  . flecainide (TAMBOCOR) 100 MG tablet TAKE 1 TABLET(100 MG) BY MOUTH TWICE DAILY 180 tablet 2  . fluticasone (FLONASE) 50 MCG/ACT nasal spray Place 1 spray into both nostrils daily as needed for allergies.     . Glucos-Chond-Hyal Ac-Ca Fructo (MOVE FREE JOINT HEALTH ADVANCE PO) Take 1 tablet by mouth daily.    Logan Diaz ibuprofen (ADVIL,MOTRIN) 200 MG tablet Take 3 tablets by mouth as needed for pain.    Logan Diaz MAGNESIUM PO Take 400 mg by mouth daily. 1-2 TABLETS BY MOUTH DAILY     . Omega-3 Fatty Acids (FISH OIL PO) Take 500 mg by mouth daily.     . sildenafil (REVATIO) 20 MG tablet 20 mg. TAKE 2-5 TABLETS ONCE A DAY AS NEEDED , ONE HOUR PRIOR TO SEX     . tamsulosin (FLOMAX) 0.4 MG CAPS capsule Take 0.4 mg by mouth daily after breakfast.   0   No current facility-administered medications for this visit.    Allergies:   Patient has no known allergies.   Social History:  The patient  reports that he quit smoking about 45 years ago. His smoking use included cigarettes. He quit after 2.00 years of use. He has never used smokeless tobacco. He reports previous alcohol use. He reports that he does not use drugs.   Family History:  The patient's family history includes Healthy in his daughter, father, mother, and son.  ROS:  Please see the history of present illness.  All other systems are reviewed and otherwise negative.   PHYSICAL EXAM:  VS:  BP 128/72   Pulse 87    Ht 6\' 1"  (1.854 m)   Wt 233 lb (105.7 kg)   SpO2 97%   BMI 30.74 kg/m  BMI: Body mass index is 30.74 kg/m. Well nourished, well developed, in no acute distress  HEENT: normocephalic, atraumatic  Neck: no JVD, carotid bruits or masses Cardiac: RRR; no significant murmurs, no rubs, or gallops Lungs:  CTA b/l, no wheezing, rhonchi or rales  Abd: soft, nontender MS: no deformity or atrophy Ext: no edema  Skin: warm and dry, no rash Neuro:  No gross deficits appreciated Psych: euthymic mood, full affect    EKG:  Done today and reviewed by myself:  04/05/20: SR 87bpm,  stable intervals 10/02/2019: SR 67bpm, stable intervals   11/15/2017: TTE Study Conclusions  - Left ventricle: The cavity size was normal. Wall thickness was  increased in a pattern of mild LVH. Systolic function was normal.  The estimated ejection fraction was in the range of 55% to 60%.  Wall motion was normal; there were no regional wall motion  abnormalities. Left ventricular diastolic function parameters  were normal.  - Mitral valve: There was mild regurgitation.  - Atrial septum: No defect or patent foramen ovale was identified.    12/11/2019: EST: (on flecainide)  Blood pressure demonstrated a hypertensive response to exercise.  There was no ST segment deviation noted during stress.   1. Normal exercise tolerance.  2. No evidence for ischemia by ST segment analysis.    May 2019: 48hr Holter NSR PVC;s 26% of beats couplets occasional NSVT only 3 beats  PAC;s  Not a candidate for beta blocker due to resting bradycardia F/u with EP to consider AAT to suppress  Recent Labs: No results found for requested labs within last 8760 hours.  No results found for requested labs within last 8760 hours.   CrCl cannot be calculated (Patient's most recent lab result is older than the maximum 21 days allowed.).   Wt Readings from Last 3 Encounters:  04/05/20 233 lb (105.7 kg)  10/02/19 247 lb (112  kg)  04/03/19 248 lb (112.5 kg)     Other studies reviewed: Additional studies/records reviewed today include: summarized above  ASSESSMENT AND PLAN:  1. PVCs     On flecainide     Not felt a candidate for nodal blocking agent with some baseline bradycardia     Stable intervals  2. HTN ?     Not on any agent     BP looks ok   3. HLD     Monitored and managed by his PMD    Disposition:will continue Q 50mo visits/EKGs, sooner if needed.   Current medicines are reviewed at length with the patient today.  The patient did not have any concerns regarding medicines.  Venetia Night, PA-C 04/05/2020 12:37 PM     [place] [address] [phone] (office)  [phone] (fax)

## 2020-04-05 NOTE — Patient Instructions (Signed)
Medication Instructions:  Your physician recommends that you continue on your current medications as directed. Please refer to the Current Medication list given to you today.  *If you need a refill on your cardiac medications before your next appointment, please call your pharmacy*   Lab Work: NONE ORDERED  TODAY   If you have labs (blood work) drawn today and your tests are completely normal, you will receive your results only by: . MyChart Message (if you have MyChart) OR . A paper copy in the mail If you have any lab test that is abnormal or we need to change your treatment, we will call you to review the results.   Testing/Procedures: NONE ORDERED  TODAY   Follow-Up: At [facility], you and your health needs are our priority.  As part of our continuing mission to provide you with exceptional heart care, we have created designated Provider Care Teams.  These Care Teams include your primary Cardiologist (physician) and Advanced Practice Providers (APPs -  Physician Assistants and Nurse Practitioners) who all work together to provide you with the care you need, when you need it.  We recommend signing up for the patient portal called "MyChart".  Sign up information is provided on this After Visit Summary.  MyChart is used to connect with patients for Virtual Visits (Telemedicine).  Patients are able to view lab/test results, encounter notes, upcoming appointments, etc.  Non-urgent messages can be sent to your provider as well.   To learn more about what you can do with MyChart, go to [email or web address].    Your next appointment:   6 month(s)  The format for your next appointment:   In Person  Provider:   You may see Will Martin Camnitz, MD or one of the following Advanced Practice Providers on your designated Care Team:    Amber Seiler, NP  Renee Ursuy, PA-C  Michael "Andy" Tillery, PA-C    Other Instructions   

## 2020-04-18 ENCOUNTER — Emergency Department (HOSPITAL_COMMUNITY)
Admission: EM | Admit: 2020-04-18 | Discharge: 2020-04-18 | Disposition: A | Discharge status: Home / Self Care | Payer: BC Managed Care – PPO | Attending: Emergency Medicine | Admitting: Emergency Medicine

## 2020-04-18 ENCOUNTER — Emergency Department (HOSPITAL_COMMUNITY): Payer: BC Managed Care – PPO

## 2020-04-18 ENCOUNTER — Encounter (HOSPITAL_COMMUNITY): Payer: Self-pay

## 2020-04-18 ENCOUNTER — Other Ambulatory Visit: Payer: Self-pay

## 2020-04-18 DIAGNOSIS — Z87891 Personal history of nicotine dependence: Secondary | ICD-10-CM | POA: Insufficient documentation

## 2020-04-18 DIAGNOSIS — J189 Pneumonia, unspecified organism: Secondary | ICD-10-CM | POA: Diagnosis not present

## 2020-04-18 DIAGNOSIS — J1282 Pneumonia due to coronavirus disease 2019: Secondary | ICD-10-CM | POA: Insufficient documentation

## 2020-04-18 DIAGNOSIS — U071 COVID-19: Secondary | ICD-10-CM | POA: Diagnosis not present

## 2020-04-18 DIAGNOSIS — Z85828 Personal history of other malignant neoplasm of skin: Secondary | ICD-10-CM | POA: Diagnosis not present

## 2020-04-18 DIAGNOSIS — R0602 Shortness of breath: Secondary | ICD-10-CM | POA: Diagnosis not present

## 2020-04-18 DIAGNOSIS — R509 Fever, unspecified: Secondary | ICD-10-CM | POA: Diagnosis not present

## 2020-04-18 LAB — CBC WITH DIFFERENTIAL/PLATELET
Abs Immature Granulocytes: 0.07 10*3/uL (ref 0.00–0.07)
Basophils Absolute: 0 10*3/uL (ref 0.0–0.1)
Basophils Relative: 0 %
Eosinophils Absolute: 0.1 10*3/uL (ref 0.0–0.5)
Eosinophils Relative: 1 %
HCT: 42.1 % (ref 39.0–52.0)
Hemoglobin: 14.1 g/dL (ref 13.0–17.0)
Immature Granulocytes: 1 %
Lymphocytes Relative: 9 %
Lymphs Abs: 1 10*3/uL (ref 0.7–4.0)
MCH: 30.5 pg (ref 26.0–34.0)
MCHC: 33.5 g/dL (ref 30.0–36.0)
MCV: 90.9 fL (ref 80.0–100.0)
Monocytes Absolute: 0.5 10*3/uL (ref 0.1–1.0)
Monocytes Relative: 5 %
Neutro Abs: 8.8 10*3/uL — ABNORMAL HIGH (ref 1.7–7.7)
Neutrophils Relative %: 84 %
Platelets: 337 10*3/uL (ref 150–400)
RBC: 4.63 MIL/uL (ref 4.22–5.81)
RDW: 13.4 % (ref 11.5–15.5)
WBC: 10.4 10*3/uL (ref 4.0–10.5)
nRBC: 0 % (ref 0.0–0.2)

## 2020-04-18 LAB — COMPREHENSIVE METABOLIC PANEL
ALT: 80 U/L — ABNORMAL HIGH (ref 0–44)
AST: 65 U/L — ABNORMAL HIGH (ref 15–41)
Albumin: 3.1 g/dL — ABNORMAL LOW (ref 3.5–5.0)
Alkaline Phosphatase: 58 U/L (ref 38–126)
Anion gap: 11 (ref 5–15)
BUN: 15 mg/dL (ref 8–23)
CO2: 24 mmol/L (ref 22–32)
Calcium: 8.2 mg/dL — ABNORMAL LOW (ref 8.9–10.3)
Chloride: 98 mmol/L (ref 98–111)
Creatinine, Ser: 0.83 mg/dL (ref 0.61–1.24)
GFR calc non Af Amer: >60 mL/min (ref >60)
Glucose, Bld: 161 mg/dL — ABNORMAL HIGH (ref 70–99)
Potassium: 3.6 mmol/L (ref 3.5–5.1)
Sodium: 133 mmol/L — ABNORMAL LOW (ref 135–145)
Total Bilirubin: 0.7 mg/dL (ref 0.3–1.2)
Total Protein: 6.8 g/dL (ref 6.5–8.1)

## 2020-04-18 LAB — FERRITIN: Ferritin: 1009 ng/mL — ABNORMAL HIGH (ref 24–336)

## 2020-04-18 LAB — C-REACTIVE PROTEIN: CRP: 12.9 mg/dL — ABNORMAL HIGH (ref <1.0)

## 2020-04-18 LAB — LACTATE DEHYDROGENASE: LDH: 374 U/L — ABNORMAL HIGH (ref 98–192)

## 2020-04-18 LAB — D-DIMER, QUANTITATIVE: D-Dimer, Quant: 1.87 ug/mL-FEU — ABNORMAL HIGH (ref 0.00–0.50)

## 2020-04-18 LAB — PROCALCITONIN: Procalcitonin: <0.1 ng/mL

## 2020-04-18 LAB — FIBRINOGEN: Fibrinogen: >800 mg/dL — ABNORMAL HIGH (ref 210–475)

## 2020-04-18 LAB — RESPIRATORY PANEL BY RT PCR (FLU A&B, COVID)
Influenza A by PCR: NEGATIVE
Influenza B by PCR: NEGATIVE
SARS Coronavirus 2 by RT PCR: POSITIVE — AB

## 2020-04-18 LAB — LACTIC ACID, PLASMA: Lactic Acid, Venous: 1.9 mmol/L (ref 0.5–1.9)

## 2020-04-18 LAB — TRIGLYCERIDES: Triglycerides: 94 mg/dL (ref <150)

## 2020-04-18 MED ORDER — DEXAMETHASONE SODIUM PHOSPHATE 10 MG/ML IJ SOLN
10.0000 mg | Freq: Once | INTRAMUSCULAR | Status: AC
  Administered 2020-04-18: 10 mg via INTRAVENOUS
  Filled 2020-04-18: qty 1

## 2020-04-18 NOTE — ED Triage Notes (Signed)
Pt states he tested COVID+ 9/25. Pt c/o fever, fatigue, and low O2 levels at home.

## 2020-04-18 NOTE — ED Notes (Signed)
Per Dr. Maryan Rued, monitor pt on RA. Pt currently 92% RA.

## 2020-04-18 NOTE — Discharge Instructions (Addendum)
If you start having worsening shortness of breath and cannot catch her breath even at rest you need to return to the emergency room.  Make sure you are staying hydrated and try not to lay flat on your back.

## 2020-04-18 NOTE — ED Notes (Signed)
This Probation officer ambulated with patient . Pt oxygen stats stayed t 90% while ambulating. EDP Plunkett notified.

## 2020-04-18 NOTE — ED Provider Notes (Addendum)
[facility] DEPT Provider Note   CSN: [id number] Arrival date & time: 04/18/20  1735     History Chief Complaint  Patient presents with   Covid Positive   Fatigue    Logan Diaz is a 64 y.o. male.  Patient is a 64 year old male with a history of dysrhythmia on flecainide and prostate issues on medications who is presenting today with worsening Covid symptoms.  Patient tested Covid positive on 01/05/2020.  Since that time he has had high fevers, generalized body aches, cough, fatigue and generalized weakness.  He has had change in taste and poor oral intake.  He has been drinking plenty of water and urinating regularly.  He went to his doctor today to be checked and at that time his oxygen saturation was 87% on room air.  He reports he has not felt short of breath but gets very tired with just minimal activity.  He continues to run fevers up to 103 and last time he had a fever was today.  Doctor encouraged him to come here for further evaluation.  Upon arrival here patient was satting 85% on room air and was placed on 3 L of oxygen.  He has no abdominal pain, vomiting or diarrhea.  He denies any unilateral leg pain or swelling.  He did not receive the Covid vaccine.  The history is provided by the patient.       Past Medical History:  Diagnosis Date   Blindness of left eye    SECONDARY TO A MAGNOLIA TREE PRICKLY BUD ACCIDENT AT AGE 70   BPH (benign prostatic hyperplasia)    Bradycardia    Complication of anesthesia    Hard to wake up   ED (erectile dysfunction)    History of exercise intolerance dr Curt Bears   12-10-2017  normal exercise tolerence,  no evidence ishemia by ST segment analysis   Hyperlipidemia    Irregular heart beat    LVH (left ventricular hypertrophy) 2019   Mild, noted on ECHO   MR (mitral regurgitation) 2019   Mild, noted on ECHO   OA (osteoarthritis)    LEFT KNEE   PAC (premature atrial contraction)     Pre-diabetes    PVCs (premature ventricular contractions)    cardiology EP -- dr Curt Bears--  holter monitor w/  PVCs 26% beats couplets occasional NSVT only 3 beats   Rotator cuff tear, right    Seasonal allergies    Skin cancer    top of head   Spinal stenosis, cervical region    Mild   Umbilical hernia    Wears glasses     Patient Active Problem List   Diagnosis Date Noted   PVCs (premature ventricular contractions)    Pain in right knee 04/25/2018   S/P arthroscopy of left knee 12/23/2017   Pain in joint of right shoulder 10/08/2017   Pain in left knee 10/08/2017    Past Surgical History:  Procedure Laterality Date   ANTERIOR CERVICAL DECOMP/DISCECTOMY FUSION  07/2017   C6-7   CARDIOVASCULAR STRESS TEST  11-15-2017   dr Johnsie Cancel   Low risk nuclear study w/ probable inferoseptal and apical thinning but no significant ischemia;  study no gated due to ectopy   COLONOSCOPY     EYE SURGERY Left x2 @ age 34   injury--  and  Catarat sx @ age 8 and 37 approx.   KNEE ARTHROSCOPY Left 1980s   KNEE ARTHROSCOPY Left 12/23/2017   Procedure: Left knee  arthroscopy, debridement removal loose bodies, chondroplasty and partial lateral meniscectomy;  Surgeon: Sydnee Cabal, MD;  Location: [facility];  Service: Orthopedics;  Laterality: Left;  45 mins / to stay over night monitor floor cardiology per [place] ARTHROSCOPY WITH ROTATOR CUFF REPAIR Right 01/10/2019   Procedure: SHOULDER ARTHROSCOPY WITH ROTATOR CUFF REPAIR biceps tenodesis, subacromial decompression, distal calvicle excision;  Surgeon: Sydnee Cabal, MD;  Location: [facility];  Service: Orthopedics;  Laterality: Right;  Interscalene block   TRANSTHORACIC ECHOCARDIOGRAM  11-15-2017   dr Johnsie Cancel   mild LVH, ef 55-60%/  mild MR, PR and TR   UMBILICAL HERNIA REPAIR N/A 07/18/2018   Procedure: UMBILICAL HERNIA REPAIR;  Surgeon: Rolm Bookbinder, MD;  Location: [facility];  Service: General;  Laterality: N/A;  GENERAL AND TAP BLOCK       Family History  Problem Relation Age of Onset   Healthy Son    Healthy Daughter    Healthy Mother    Healthy Father     Social History   Tobacco Use   Smoking status: Former Smoker    Years: 2.00    Types: Cigarettes    Quit date: 12/21/1974    Years since quitting: 45.3   Smokeless tobacco: Never Used  Vaping Use   Vaping Use: Never used  Substance Use Topics   Alcohol use: Not Currently   Drug use: Never    Home Medications Prior to Admission medications   Medication Sig Start Date End Date Taking? Authorizing Provider  finasteride (PROSCAR) 5 MG tablet Take 5 mg by mouth every morning.  11/09/17   [provider]  flecainide (TAMBOCOR) 100 MG tablet TAKE 1 TABLET(100 MG) BY MOUTH TWICE DAILY 08/14/19   Shirley Friar, PA-C  fluticasone [facility]) 50 MCG/ACT nasal spray Place 1 spray into both nostrils daily as needed for allergies.     [provider]  Glucos-Chond-Hyal Ac-Ca Fructo (MOVE FREE JOINT HEALTH ADVANCE PO) Take 1 tablet by mouth daily.    [provider]  ibuprofen (ADVIL,MOTRIN) 200 MG tablet Take 3 tablets by mouth as needed for pain.    [provider]  MAGNESIUM PO Take 400 mg by mouth daily. 1-2 TABLETS BY MOUTH DAILY     [provider]  Omega-3 Fatty Acids (FISH OIL PO) Take 500 mg by mouth daily.     [provider]  sildenafil (REVATIO) 20 MG tablet 20 mg. TAKE 2-5 TABLETS ONCE A DAY AS NEEDED , ONE HOUR PRIOR TO SEX     [provider]  tamsulosin (FLOMAX) 0.4 MG CAPS capsule Take 0.4 mg by mouth daily after breakfast.  08/16/17   [provider]    Allergies    Patient has no known allergies.  Review of Systems   Review of Systems  All other systems reviewed and are negative.   Physical Exam Updated Vital Signs BP 133/77    Pulse 63    Temp 98.8 F (37.1 C)  (Oral)    Resp 16    SpO2 90%   Physical Exam Vitals and nursing note reviewed.  Constitutional:      General: He is not in acute distress.    Appearance: Normal appearance. He is well-developed and normal weight.  HENT:     Head: Normocephalic and atraumatic.     Nose: Nose normal.     Mouth/Throat:     Mouth: Mucous membranes are  moist.  Eyes:     Conjunctiva/sclera: Conjunctivae normal.     Pupils: Pupils are equal, round, and reactive to light.  Cardiovascular:     Rate and Rhythm: Normal rate and regular rhythm.     Heart sounds: No murmur heard.   Pulmonary:     Effort: Pulmonary effort is normal. No respiratory distress.     Breath sounds: Rales present. No wheezing.     Comments: Crackles present throughout all lung fields Abdominal:     General: There is no distension.     Palpations: Abdomen is soft.     Tenderness: There is no abdominal tenderness. There is no guarding or rebound.  Musculoskeletal:        General: No tenderness. Normal range of motion.     Cervical back: Normal range of motion and neck supple.     Right lower leg: No edema.     Left lower leg: No edema.  Skin:    General: Skin is warm and dry.     Findings: No erythema or rash.  Neurological:     General: No focal deficit present.     Mental Status: He is alert and oriented to person, place, and time. Mental status is at baseline.  Psychiatric:        Mood and Affect: Mood normal.        Behavior: Behavior normal.        Thought Content: Thought content normal.     ED Results / Procedures / Treatments   Labs (all labs ordered are listed, but only abnormal results are displayed) Labs Reviewed  RESPIRATORY PANEL BY RT PCR (FLU A&B, COVID) - Abnormal; Notable for the following components:      Result Value   SARS Coronavirus 2 by RT PCR POSITIVE (*)    All other components within normal limits  CBC WITH DIFFERENTIAL/PLATELET - Abnormal; Notable for the following components:   Neutro Abs  8.8 (*)    All other components within normal limits  COMPREHENSIVE METABOLIC PANEL - Abnormal; Notable for the following components:   Sodium 133 (*)    Glucose, Bld 161 (*)    Calcium 8.2 (*)    Albumin 3.1 (*)    AST 65 (*)    ALT 80 (*)    All other components within normal limits  D-DIMER, QUANTITATIVE (NOT AT [facility]) - Abnormal; Notable for the following components:   D-Dimer, Quant 1.87 (*)    All other components within normal limits  LACTATE DEHYDROGENASE - Abnormal; Notable for the following components:   LDH 374 (*)    All other components within normal limits  FERRITIN - Abnormal; Notable for the following components:   Ferritin 1,009 (*)    All other components within normal limits  FIBRINOGEN - Abnormal; Notable for the following components:   Fibrinogen >800 (*)    All other components within normal limits  C-REACTIVE PROTEIN - Abnormal; Notable for the following components:   CRP 12.9 (*)    All other components within normal limits  CULTURE, BLOOD (ROUTINE X 2)  CULTURE, BLOOD (ROUTINE X 2)  LACTIC ACID, PLASMA  PROCALCITONIN  TRIGLYCERIDES  LACTIC ACID, PLASMA    EKG None ED ECG REPORT   Date: 04/18/2020  Rate: 66  Rhythm: normal sinus rhythm  QRS Axis: normal  Intervals: normal  ST/T Wave abnormalities: normal  Conduction Disutrbances:none  Narrative Interpretation:   Old EKG Reviewed: none available  I have personally reviewed the EKG  tracing and agree with the computerized printout as noted.  Radiology DG Chest Port 1 View  Result Date: 04/18/2020 CLINICAL DATA:  Hypoxia, COVID positive EXAM: PORTABLE CHEST 1 VIEW COMPARISON:  None. FINDINGS: The heart size and mediastinal contours are within normal limits. Multifocal patchy airspace opacities seen within the periphery of both lungs. No pleural effusion. No acute osseous abnormality. IMPRESSION: Bilateral multifocal airspace opacities consistent with multifocal pneumonia Electronically Signed    By: Prudencio Pair M.D.   On: 04/18/2020 19:13    Procedures Procedures (including critical care time)  Medications Ordered in ED Medications - No data to display  ED Course  I have reviewed the triage vital signs and the nursing notes.  Pertinent labs & imaging results that were available during my care of the patient were reviewed by me and considered in my medical decision making (see chart for details).    MDM Rules/Calculators/A&P                          Patient presenting with symptoms classic for Covid pneumonia.  He has been sick now for 12 days and has not a Mab candidate.  He was mildly hypoxic upon arrival here at 85%.  He was on 3 L of oxygen however it was discontinued and for the time being he is satting approximately 88 to 92% on room air.  He has crackles throughout all lung fields but no distal edema or other swelling.  Concerned that patient may require admission given his low oxygen saturation.  Preadmission labs are ordered and chest x-ray.  9:35 PM Labs are consistent with Covid.  Chest x-ray with multifocal pneumonia.  Patient is outside of the window for MAP.  Since oxygen has been off he has been satting between 91 and 93% on room air.  Will ambulate to ensure he does not become hypoxic however patient appears to be a candidate for discharge home.  He was given a dose of Decadron.  Patient was given strict return precautions.  9:49 PM Pt ambulated in the room staying at 90% on RA.  He denies feeling SOB and did not become tachypneic or tachycardic. DANUEL FELICETTI was evaluated in Emergency Department on 04/18/2020 for the symptoms described in the history of present illness. He was evaluated in the context of the global COVID-19 pandemic, which necessitated consideration that the patient might be at risk for infection with the SARS-CoV-2 virus that causes COVID-19. Institutional protocols and algorithms that pertain to the evaluation of patients at risk for COVID-19  are in a state of rapid change based on information released by regulatory bodies including the CDC and federal and state organizations. These policies and algorithms were followed during the patient's care in the ED.   Final Clinical Impression(s) / ED Diagnoses Final diagnoses:  Pneumonia due to COVID-19 virus    Rx / DC Orders ED Discharge Orders    None       Blanchie Dessert, MD 04/18/20 2148    Blanchie Dessert, MD 04/18/20 2149

## 2020-04-18 NOTE — ED Notes (Signed)
Pt 85% RA. Pt 90% on 2L. Pt placed on 3L [place] O2 reading 95% currently.

## 2020-04-22 DIAGNOSIS — R0902 Hypoxemia: Secondary | ICD-10-CM | POA: Diagnosis not present

## 2020-04-22 DIAGNOSIS — R059 Cough, unspecified: Secondary | ICD-10-CM | POA: Diagnosis not present

## 2020-04-22 DIAGNOSIS — U071 COVID-19: Secondary | ICD-10-CM | POA: Diagnosis not present

## 2020-04-23 LAB — CULTURE, BLOOD (ROUTINE X 2)
Culture: NO GROWTH
Culture: NO GROWTH
Special Requests: ADEQUATE
Special Requests: ADEQUATE

## 2020-05-03 ENCOUNTER — Ambulatory Visit: Payer: BC Managed Care – PPO

## 2020-05-07 DIAGNOSIS — R7303 Prediabetes: Secondary | ICD-10-CM | POA: Diagnosis not present

## 2020-05-07 DIAGNOSIS — E7800 Pure hypercholesterolemia, unspecified: Secondary | ICD-10-CM | POA: Diagnosis not present

## 2020-05-07 DIAGNOSIS — N529 Male erectile dysfunction, unspecified: Secondary | ICD-10-CM | POA: Diagnosis not present

## 2020-05-07 DIAGNOSIS — Z0000 Encounter for general adult medical examination without abnormal findings: Secondary | ICD-10-CM | POA: Diagnosis not present

## 2020-05-07 DIAGNOSIS — Z125 Encounter for screening for malignant neoplasm of prostate: Secondary | ICD-10-CM | POA: Diagnosis not present

## 2020-05-07 DIAGNOSIS — Z23 Encounter for immunization: Secondary | ICD-10-CM | POA: Diagnosis not present

## 2020-05-07 DIAGNOSIS — J129 Viral pneumonia, unspecified: Secondary | ICD-10-CM | POA: Diagnosis not present

## 2020-05-14 ENCOUNTER — Other Ambulatory Visit: Payer: Self-pay | Admitting: Student

## 2020-05-15 ENCOUNTER — Ambulatory Visit
Admission: RE | Admit: 2020-05-15 | Discharge: 2020-05-15 | Disposition: A | Discharge status: Home / Self Care | Payer: BC Managed Care – PPO | Source: Ambulatory Visit | Attending: Family Medicine | Admitting: Family Medicine

## 2020-05-15 ENCOUNTER — Other Ambulatory Visit: Payer: Self-pay | Admitting: Family Medicine

## 2020-05-15 ENCOUNTER — Telehealth: Payer: Self-pay | Admitting: Cardiology

## 2020-05-15 DIAGNOSIS — J129 Viral pneumonia, unspecified: Secondary | ICD-10-CM

## 2020-05-15 MED ORDER — FLECAINIDE ACETATE 100 MG PO TABS
ORAL_TABLET | ORAL | 3 refills | Status: DC
Start: 2020-05-15 — End: 2021-05-21

## 2020-05-15 NOTE — Telephone Encounter (Signed)
Pt's medication was sent to pt's pharmacy as requested. Confirmation received.  °

## 2020-05-15 NOTE — Telephone Encounter (Signed)
*  STAT* If patient is at the pharmacy, call can be transferred to refill team.   1. Which medications need to be refilled? (please list name of each medication and dose if known) patient need medication refill he cant remember which one. Flecainide   2. Which pharmacy/location (including street and city if local pharmacy) is medication to be sent to? [place]  3. Do they need a 30 day or 90 day supply? [place]

## 2020-06-25 DIAGNOSIS — S46012A Strain of muscle(s) and tendon(s) of the rotator cuff of left shoulder, initial encounter: Secondary | ICD-10-CM | POA: Diagnosis not present

## 2020-06-25 DIAGNOSIS — G8918 Other acute postprocedural pain: Secondary | ICD-10-CM | POA: Diagnosis not present

## 2020-06-25 DIAGNOSIS — S46112A Strain of muscle, fascia and tendon of long head of biceps, left arm, initial encounter: Secondary | ICD-10-CM | POA: Diagnosis not present

## 2020-06-25 DIAGNOSIS — M19012 Primary osteoarthritis, left shoulder: Secondary | ICD-10-CM | POA: Diagnosis not present

## 2020-06-25 DIAGNOSIS — Y999 Unspecified external cause status: Secondary | ICD-10-CM | POA: Diagnosis not present

## 2020-06-25 DIAGNOSIS — M75122 Complete rotator cuff tear or rupture of left shoulder, not specified as traumatic: Secondary | ICD-10-CM | POA: Diagnosis not present

## 2020-06-25 DIAGNOSIS — M7552 Bursitis of left shoulder: Secondary | ICD-10-CM | POA: Diagnosis not present

## 2020-06-25 DIAGNOSIS — X58XXXA Exposure to other specified factors, initial encounter: Secondary | ICD-10-CM | POA: Diagnosis not present

## 2020-06-25 DIAGNOSIS — M7542 Impingement syndrome of left shoulder: Secondary | ICD-10-CM | POA: Diagnosis not present

## 2020-06-25 DIAGNOSIS — S46292A Other injury of muscle, fascia and tendon of other parts of biceps, left arm, initial encounter: Secondary | ICD-10-CM | POA: Diagnosis not present

## 2020-07-02 DIAGNOSIS — M25512 Pain in left shoulder: Secondary | ICD-10-CM | POA: Diagnosis not present

## 2020-07-02 DIAGNOSIS — M25612 Stiffness of left shoulder, not elsewhere classified: Secondary | ICD-10-CM | POA: Diagnosis not present

## 2020-07-08 DIAGNOSIS — M19012 Primary osteoarthritis, left shoulder: Secondary | ICD-10-CM | POA: Diagnosis not present

## 2020-07-10 DIAGNOSIS — M25512 Pain in left shoulder: Secondary | ICD-10-CM | POA: Diagnosis not present

## 2020-07-10 DIAGNOSIS — M25612 Stiffness of left shoulder, not elsewhere classified: Secondary | ICD-10-CM | POA: Diagnosis not present

## 2020-07-17 DIAGNOSIS — M25612 Stiffness of left shoulder, not elsewhere classified: Secondary | ICD-10-CM | POA: Diagnosis not present

## 2020-07-17 DIAGNOSIS — M25512 Pain in left shoulder: Secondary | ICD-10-CM | POA: Diagnosis not present

## 2020-07-24 DIAGNOSIS — M25612 Stiffness of left shoulder, not elsewhere classified: Secondary | ICD-10-CM | POA: Diagnosis not present

## 2020-07-24 DIAGNOSIS — M25512 Pain in left shoulder: Secondary | ICD-10-CM | POA: Diagnosis not present

## 2020-08-01 DIAGNOSIS — M25612 Stiffness of left shoulder, not elsewhere classified: Secondary | ICD-10-CM | POA: Diagnosis not present

## 2020-08-01 DIAGNOSIS — M25512 Pain in left shoulder: Secondary | ICD-10-CM | POA: Diagnosis not present

## 2020-08-06 ENCOUNTER — Ambulatory Visit: Payer: BC Managed Care – PPO | Admitting: Cardiology

## 2020-08-07 DIAGNOSIS — M25612 Stiffness of left shoulder, not elsewhere classified: Secondary | ICD-10-CM | POA: Diagnosis not present

## 2020-08-07 DIAGNOSIS — M25512 Pain in left shoulder: Secondary | ICD-10-CM | POA: Diagnosis not present

## 2020-08-09 DIAGNOSIS — M25612 Stiffness of left shoulder, not elsewhere classified: Secondary | ICD-10-CM | POA: Diagnosis not present

## 2020-08-09 DIAGNOSIS — M25512 Pain in left shoulder: Secondary | ICD-10-CM | POA: Diagnosis not present

## 2020-09-02 DIAGNOSIS — R3914 Feeling of incomplete bladder emptying: Secondary | ICD-10-CM | POA: Diagnosis not present

## 2020-09-02 DIAGNOSIS — Z125 Encounter for screening for malignant neoplasm of prostate: Secondary | ICD-10-CM | POA: Diagnosis not present

## 2020-09-09 DIAGNOSIS — N5201 Erectile dysfunction due to arterial insufficiency: Secondary | ICD-10-CM | POA: Diagnosis not present

## 2020-09-09 DIAGNOSIS — R3914 Feeling of incomplete bladder emptying: Secondary | ICD-10-CM | POA: Diagnosis not present

## 2020-09-09 DIAGNOSIS — Z125 Encounter for screening for malignant neoplasm of prostate: Secondary | ICD-10-CM | POA: Diagnosis not present

## 2020-10-30 ENCOUNTER — Encounter (INDEPENDENT_AMBULATORY_CARE_PROVIDER_SITE_OTHER): Payer: Self-pay

## 2020-10-30 ENCOUNTER — Ambulatory Visit (INDEPENDENT_AMBULATORY_CARE_PROVIDER_SITE_OTHER): Payer: BC Managed Care – PPO | Admitting: Cardiology

## 2020-10-30 ENCOUNTER — Encounter: Payer: Self-pay | Admitting: Cardiology

## 2020-10-30 ENCOUNTER — Other Ambulatory Visit: Payer: Self-pay

## 2020-10-30 VITALS — BP 120/80 | HR 68 | Ht 73.0 in | Wt 238.0 lb

## 2020-10-30 DIAGNOSIS — I493 Ventricular premature depolarization: Secondary | ICD-10-CM

## 2020-10-30 NOTE — Progress Notes (Signed)
Electrophysiology Office Note   Date:  10/30/2020   ID:  Logan Diaz 1955-10-02, MRN [id number]  PCP:  Antony Contras, MD  Cardiologist: Johnsie Cancel Primary Electrophysiologist:  Damari Hiltz Meredith Leeds, MD    No chief complaint on file.    History of Present Illness: Logan Diaz is a 65 y.o. male who is being seen today for the evaluation of PVCs at the request of [place]. Presenting today for electrophysiology evaluation.    He has a history of hyperlipidemia and PVCs.  He has been for left knee surgery.  The anesthesiologist high burden of PVCs and was felt that he needed a cardiac lesion prior to surgery.  He had an echocardiogram that showed a normal ejection fraction.  Cardiac monitor showed a 23% PVC burden.  He was started on flecainide.  Today, denies symptoms of palpitations, chest pain, shortness of breath, orthopnea, PND, lower extremity edema, claudication, dizziness, presyncope, syncope, bleeding, or neurologic sequela. The patient is tolerating medications without difficulties.  Since last being seen he has done well.  He has no chest pain or shortness of breath.  Is able do all of his daily activities.  He states when he misses a dose of his flecainide, he has significant PVCs.  He takes a dose, and they terminate.   Past Medical History:  Diagnosis Date  . Blindness of left eye    SECONDARY TO A MAGNOLIA TREE PRICKLY BUD ACCIDENT AT AGE 49  . BPH (benign prostatic hyperplasia)   . Bradycardia   . Complication of anesthesia    Hard to wake up  . ED (erectile dysfunction)   . History of exercise intolerance dr Curt Bears   12-10-2017  normal exercise tolerence,  no evidence ishemia by ST segment analysis  . Hyperlipidemia   . Irregular heart beat   . LVH (left ventricular hypertrophy) 2019   Mild, noted on ECHO  . MR (mitral regurgitation) 2019   Mild, noted on ECHO  . OA (osteoarthritis)    LEFT KNEE  . PAC (premature atrial contraction)   .  Pre-diabetes   . PVCs (premature ventricular contractions)    cardiology EP -- dr Curt Bears--  holter monitor w/  PVCs 26% beats couplets occasional NSVT only 3 beats  . Rotator cuff tear, right   . Seasonal allergies   . Skin cancer    top of head  . Spinal stenosis, cervical region    Mild  . Umbilical hernia   . Wears glasses    Past Surgical History:  Procedure Laterality Date  . ANTERIOR CERVICAL DECOMP/DISCECTOMY FUSION  07/2017   C6-7  . CARDIOVASCULAR STRESS TEST  11-15-2017   dr Johnsie Cancel   Low risk nuclear study w/ probable inferoseptal and apical thinning but no significant ischemia;  study no gated due to ectopy  . COLONOSCOPY    . EYE SURGERY Left x2 @ age 53   injury--  and  Catarat sx @ age 33 and 57 approx.  Marland Kitchen KNEE ARTHROSCOPY Left 1980s  . KNEE ARTHROSCOPY Left 12/23/2017   Procedure: Left knee arthroscopy, debridement removal loose bodies, chondroplasty and partial lateral meniscectomy;  Surgeon: Sydnee Cabal, MD;  Location: [facility];  Service: Orthopedics;  Laterality: Left;  45 mins / to stay over night monitor floor cardiology per [facility]  . [place] SURGERY  1990s  . SHOULDER ARTHROSCOPY WITH ROTATOR CUFF REPAIR Right 01/10/2019   Procedure: SHOULDER ARTHROSCOPY WITH ROTATOR CUFF REPAIR biceps tenodesis, subacromial decompression,  distal calvicle excision;  Surgeon: Sydnee Cabal, MD;  Location: [facility];  Service: Orthopedics;  Laterality: Right;  Interscalene block  . TRANSTHORACIC ECHOCARDIOGRAM  11-15-2017   dr Johnsie Cancel   mild LVH, ef 55-60%/  mild MR, PR and TR  . UMBILICAL HERNIA REPAIR N/A 07/18/2018   Procedure: UMBILICAL HERNIA REPAIR;  Surgeon: Rolm Bookbinder, MD;  Location: [place];  Service: General;  Laterality: N/A;  GENERAL AND TAP BLOCK     Current Outpatient Medications  Medication Sig Dispense Refill  . acetaminophen (TYLENOL) 500 MG tablet Take 1,000 mg by mouth every 6 (six) hours as needed for moderate  pain.    . finasteride (PROSCAR) 5 MG tablet Take 5 mg by mouth daily.   0  . flecainide (TAMBOCOR) 100 MG tablet TAKE 1 TABLET(100 MG) BY MOUTH TWICE DAILY 180 tablet 3  . Glucos-Chond-Hyal Ac-Ca Fructo (MOVE FREE JOINT HEALTH ADVANCE PO) Take 1 tablet by mouth daily.    Marland Kitchen MAGNESIUM PO Take 2 tablets by mouth daily.     . Omega-3 Fatty Acids (FISH OIL PO) Take 1 tablet by mouth daily.     . sildenafil (REVATIO) 20 MG tablet Take 20 mg by mouth as needed (for erectile dysfunction).     . tamsulosin (FLOMAX) 0.4 MG CAPS capsule Take 0.4 mg by mouth daily.   0   No current facility-administered medications for this visit.    Allergies:   Patient has no known allergies.   Social History:  The patient  reports that he quit smoking about 45 years ago. His smoking use included cigarettes. He quit after 2.00 years of use. He has never used smokeless tobacco. He reports previous alcohol use. He reports that he does not use drugs.   Family History:  The patient's family history includes Healthy in his daughter, father, mother, and son.   ROS:  Please see the history of present illness.   Otherwise, review of systems is positive for none.   All other systems are reviewed and negative.   PHYSICAL EXAM: VS:  BP 120/80 (BP Location: Left Arm, Patient Position: Sitting, Cuff Size: Normal)   Pulse 68   Ht 6\' 1"  (1.854 m)   Wt 238 lb (108 kg)   SpO2 96%   BMI 31.40 kg/m  , BMI Body mass index is 31.4 kg/m. GEN: Well nourished, well developed, in no acute distress  HEENT: normal  Neck: no JVD, carotid bruits, or masses Cardiac: RRR; no murmurs, rubs, or gallops,no edema  Respiratory:  clear to auscultation bilaterally, normal work of breathing GI: soft, nontender, nondistended, + BS MS: no deformity or atrophy  Skin: warm and dry Neuro:  Strength and sensation are intact Psych: euthymic mood, full affect  EKG:  EKG is ordered today. Personal review of the ekg ordered shows sinus rhythm, rate  68  Recent Labs: 04/18/2020: ALT 80; BUN 15; Creatinine, Ser 0.83; Hemoglobin 14.1; Platelets 337; Potassium 3.6; Sodium 133    Lipid Panel     Component Value Date/Time   TRIG 94 04/18/2020 1826     Wt Readings from Last 3 Encounters:  10/30/20 238 lb (108 kg)  04/05/20 233 lb (105.7 kg)  10/02/19 247 lb (112 kg)      Other studies Reviewed: Additional studies/ records that were reviewed today include: TTE 11/15/17  Review of the above records today demonstrates:  - Left ventricle: The cavity size was normal. Wall thickness was   increased in a pattern of  mild LVH. Systolic function was normal.   The estimated ejection fraction was in the range of 55% to 60%.   Wall motion was normal; there were no regional wall motion   abnormalities. Left ventricular diastolic function parameters   were normal. - Mitral valve: There was mild regurgitation. - Atrial septum: No defect or patent foramen ovale was identified.  Holter 11/15/17 - personally reviewed NSR PVC;s 26% of beats couplets occasional NSVT only 3 beats  PACs  SPECT 11/15/17  Blood pressure demonstrated a normal response to exercise.  There was no ST segment deviation noted during stress.  Defect 1: There is a medium defect of moderate severity present in the basal inferoseptal, mid inferoseptal and apex location.  This is a low risk study.   Abnormal, low risk stress nuclear study with probable inferoseptal and apical thinning but no significant ischemia; study not gated due to ectopy; suggest echo to assess LV function.   ETT 12/10/17 - personally reviewed  Blood pressure demonstrated a hypertensive response to exercise.  There was no ST segment deviation noted during stress.   1. Normal exercise tolerance.  2. No evidence for ischemia by ST segment analysis.   ASSESSMENT AND PLAN:  1.  PVCs: Holter monitor with a 26% burden.  Echo shows a normal ejection fraction.  Echo shows a normal ejection fraction.   Currently on flecainide.  High risk medication monitoring.  He continues to feel well.  When he does not take his flecainide, he does notice PVCs, when he takes the medication, he does not have palpitations.  No changes.   Current medicines are reviewed at length with the patient today.   The patient does not have concerns regarding his medicines.  The following changes were made today: None  Labs/ tests ordered today include:  Orders Placed This Encounter  Procedures  . EKG 12-Lead     Disposition:   FU with Abram Sax 12 months  Signed, Aileen Amore Meredith Leeds, MD  10/30/2020 9:56 AM     [facility] [address] [place] [address] [phone] (office) [phone] (fax)

## 2020-10-30 NOTE — Patient Instructions (Signed)
Medication Instructions:  Your physician recommends that you continue on your current medications as directed. Please refer to the Current Medication list given to you today.  *If you need a refill on your cardiac medications before your next appointment, please call your pharmacy*   Lab Work: None ordered   Testing/Procedures: None ordered   Follow-Up: At [facility], you and your health needs are our priority.  As part of our continuing mission to provide you with exceptional heart care, we have created designated Provider Care Teams.  These Care Teams include your primary Cardiologist (physician) and Advanced Practice Providers (APPs -  Physician Assistants and Nurse Practitioners) who all work together to provide you with the care you need, when you need it.  We recommend signing up for the patient portal called "MyChart".  Sign up information is provided on this After Visit Summary.  MyChart is used to connect with patients for Virtual Visits (Telemedicine).  Patients are able to view lab/test results, encounter notes, upcoming appointments, etc.  Non-urgent messages can be sent to your provider as well.   To learn more about what you can do with MyChart, go to [email or web address].    Your next appointment:   1 year(s)  The format for your next appointment:   In Person  Provider:   Will Camnitz, MD    Thank you for choosing [facility]!!   Lui Bellis, RN [phone]    

## 2020-11-11 DIAGNOSIS — M25531 Pain in right wrist: Secondary | ICD-10-CM | POA: Diagnosis not present

## 2020-11-11 DIAGNOSIS — M654 Radial styloid tenosynovitis [de Quervain]: Secondary | ICD-10-CM | POA: Diagnosis not present

## 2020-11-11 DIAGNOSIS — M25532 Pain in left wrist: Secondary | ICD-10-CM | POA: Diagnosis not present

## 2020-11-18 DIAGNOSIS — G5602 Carpal tunnel syndrome, left upper limb: Secondary | ICD-10-CM | POA: Diagnosis not present

## 2020-11-21 DIAGNOSIS — M25531 Pain in right wrist: Secondary | ICD-10-CM | POA: Diagnosis not present

## 2020-11-21 DIAGNOSIS — G5602 Carpal tunnel syndrome, left upper limb: Secondary | ICD-10-CM | POA: Diagnosis not present

## 2020-11-21 DIAGNOSIS — M25532 Pain in left wrist: Secondary | ICD-10-CM | POA: Diagnosis not present

## 2020-11-22 DIAGNOSIS — G5602 Carpal tunnel syndrome, left upper limb: Secondary | ICD-10-CM | POA: Diagnosis not present

## 2020-11-22 DIAGNOSIS — M654 Radial styloid tenosynovitis [de Quervain]: Secondary | ICD-10-CM | POA: Diagnosis not present

## 2020-12-04 DIAGNOSIS — H40011 Open angle with borderline findings, low risk, right eye: Secondary | ICD-10-CM | POA: Diagnosis not present

## 2020-12-20 DIAGNOSIS — G5602 Carpal tunnel syndrome, left upper limb: Secondary | ICD-10-CM | POA: Diagnosis not present

## 2021-05-21 ENCOUNTER — Other Ambulatory Visit: Payer: Self-pay | Admitting: Cardiology

## 2021-06-19 DIAGNOSIS — N529 Male erectile dysfunction, unspecified: Secondary | ICD-10-CM | POA: Diagnosis not present

## 2021-06-19 DIAGNOSIS — Z0000 Encounter for general adult medical examination without abnormal findings: Secondary | ICD-10-CM | POA: Diagnosis not present

## 2021-06-19 DIAGNOSIS — Z125 Encounter for screening for malignant neoplasm of prostate: Secondary | ICD-10-CM | POA: Diagnosis not present

## 2021-06-19 DIAGNOSIS — E7800 Pure hypercholesterolemia, unspecified: Secondary | ICD-10-CM | POA: Diagnosis not present

## 2021-06-19 DIAGNOSIS — I493 Ventricular premature depolarization: Secondary | ICD-10-CM | POA: Diagnosis not present

## 2021-06-19 DIAGNOSIS — R7303 Prediabetes: Secondary | ICD-10-CM | POA: Diagnosis not present

## 2021-08-01 DIAGNOSIS — M722 Plantar fascial fibromatosis: Secondary | ICD-10-CM | POA: Diagnosis not present

## 2021-08-12 DIAGNOSIS — L570 Actinic keratosis: Secondary | ICD-10-CM | POA: Diagnosis not present

## 2021-08-26 DIAGNOSIS — J019 Acute sinusitis, unspecified: Secondary | ICD-10-CM | POA: Diagnosis not present

## 2021-09-02 DIAGNOSIS — R3914 Feeling of incomplete bladder emptying: Secondary | ICD-10-CM | POA: Diagnosis not present

## 2021-09-02 DIAGNOSIS — Z125 Encounter for screening for malignant neoplasm of prostate: Secondary | ICD-10-CM | POA: Diagnosis not present

## 2021-09-09 DIAGNOSIS — N5201 Erectile dysfunction due to arterial insufficiency: Secondary | ICD-10-CM | POA: Diagnosis not present

## 2021-09-09 DIAGNOSIS — R3914 Feeling of incomplete bladder emptying: Secondary | ICD-10-CM | POA: Diagnosis not present

## 2021-09-09 DIAGNOSIS — Z125 Encounter for screening for malignant neoplasm of prostate: Secondary | ICD-10-CM | POA: Diagnosis not present

## 2021-12-01 ENCOUNTER — Other Ambulatory Visit: Payer: Self-pay

## 2021-12-01 MED ORDER — FLECAINIDE ACETATE 100 MG PO TABS: ORAL_TABLET | ORAL | 0 refills | Status: DC

## 2021-12-31 DIAGNOSIS — M25561 Pain in right knee: Secondary | ICD-10-CM | POA: Diagnosis not present

## 2021-12-31 DIAGNOSIS — M25562 Pain in left knee: Secondary | ICD-10-CM | POA: Diagnosis not present

## 2021-12-31 DIAGNOSIS — M170 Bilateral primary osteoarthritis of knee: Secondary | ICD-10-CM | POA: Diagnosis not present

## 2022-01-12 ENCOUNTER — Other Ambulatory Visit: Payer: Self-pay

## 2022-01-12 MED ORDER — FLECAINIDE ACETATE 100 MG PO TABS: 100.0000 mg | ORAL_TABLET | Freq: Two times a day (BID) | ORAL | 0 refills | Status: DC

## 2022-01-28 ENCOUNTER — Other Ambulatory Visit: Payer: Self-pay

## 2022-01-28 ENCOUNTER — Telehealth: Payer: Self-pay | Admitting: Cardiology

## 2022-01-28 MED ORDER — FLECAINIDE ACETATE 100 MG PO TABS: 100.0000 mg | ORAL_TABLET | Freq: Two times a day (BID) | ORAL | 0 refills | Status: DC

## 2022-01-28 NOTE — Telephone Encounter (Signed)
flecainide (TAMBOCOR) 100 MG tablet Medication Date: 01/12/2022 Department: [address] Office Ordering/Authorizing: Constance Haw, MD   Order Providers  Prescribing Provider Encounter Provider  Constance Haw, MD Carter Kitten D, CMA   Outpatient Medication Detail   Disp Refills Start End   flecainide (TAMBOCOR) 100 MG tablet 30 tablet 0 01/12/2022    Sig - Route: Take 1 tablet (100 mg total) by mouth 2 (two) times daily. - Oral   Sent to pharmacy as: flecainide (TAMBOCOR) 100 MG tablet   Notes to Pharmacy: Please call our office to schedule an overdue appointment with Dr. Curt Bears before anymore refills. [phone]. Thank you 2nd attempt   E-Prescribing Status: Receipt confirmed by pharmacy (01/12/2022 11:06 AM EDT)    Pharmacy  [facility] [id number] - [name], [place] - [address]   Additional Information  Associated Reports  View Encounter  Priority and Order Details

## 2022-01-28 NOTE — Telephone Encounter (Signed)
*  STAT* If patient is at the pharmacy, call can be transferred to refill team.   1. Which medications need to be refilled? (please list name of each medication and dose if known)  flecainide (TAMBOCOR) 100 MG tablet  2. Which pharmacy/location (including street and city if local pharmacy) is medication to be sent to? [facility] [id number] - [place], [place]  3. Do they need a 30 day or 90 day supply? 90 with refills   Patient scheduled to see Tommye Standard 02/05/22  Patient is out of medication

## 2022-01-28 NOTE — Telephone Encounter (Signed)
flecainide (TAMBOCOR) 100 MG tablet Medication Date: 01/12/2022 Department: [facility] Office Ordering/Authorizing: Constance Haw, MD   Order Providers  Prescribing Provider Encounter Provider  Constance Haw, MD Carter Kitten D, CMA   Outpatient Medication Detail   Disp Refills Start End   flecainide (TAMBOCOR) 100 MG tablet 30 tablet 0 01/12/2022    Sig - Route: Take 1 tablet (100 mg total) by mouth 2 (two) times daily. - Oral   Sent to pharmacy as: flecainide (TAMBOCOR) 100 MG tablet   Notes to Pharmacy: Please call our office to schedule an overdue appointment with Dr. Curt Bears before anymore refills. [phone]. Thank you 2nd attempt   E-Prescribing Status: Receipt confirmed by pharmacy (01/12/2022 11:06 AM EDT)    Pharmacy  [facility] [id number] - [name], [place] - [address]   Additional Information  Associated Reports  View Encounter  Priority and Order Details

## 2022-02-04 DIAGNOSIS — H40013 Open angle with borderline findings, low risk, bilateral: Secondary | ICD-10-CM | POA: Diagnosis not present

## 2022-02-04 NOTE — Progress Notes (Unsigned)
Cardiology Office Note Date:  02/05/2022  Patient ID:  Logan Diaz, Logan Diaz February 10, 1956, MRN [id number] PCP:  Antony Contras, MD  Cardiologist:  Dr. Johnsie Cancel Electrophysiologist: Dr. Curt Bears    Chief Complaint:  6 mo visit  History of Present Illness: Logan Diaz is a 66 y.o. male with history of HLD, HTN, blind L eye (childhood accident), PVCs  He saw Dr. Curt Bears, last seen by EP service with [place]. [place], [place] Sept 2020.  At that time doing well, had no complaints. His BP was high, though reported better at home.  No changes were made to his regime.  I saw him 10/02/19 He feels well.  Denies any kind of cardiac awareness (never did feel his PVCs), no CP, palpitations.  No SOB, DOE.  No exertional intolerances. 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 No dizzy spells, near syncope or syncope. He inquires about OTC testosterone replacement type products reports some generalized lack of energy.  Reports his testosterone measured low end of normal range, and he suspects this as the culprit.  He is not physically limited. his PMD did not prescribe anything.  And asked he inquire with [name]. He was advised to keep a sleep diary, discussed importance of adequate sleep, advised against using any products with stimulants in it and to confer with his pharmacist prior to taking any products prescribed or OTC that is is ok with his medicines, Flecainide.  I saw him 03/2020 He reports feeling very well.  Opted not to pursue testosterone replacement. 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 His PVCs are very well controlled, states even if a couple hours late he will start to feel some PVCs and shortly after taking his flecainide, they simmer right down. He is very happy with his current regime. No dizzy spells, near syncope or syncope. PMD does annual labs Stable intervals, no changes made  He saw Dr. Curt Bears 10/30/2020, doing well, can tell if he  has missed flecainide with recurrent PVCs.otherwise well controlled, no changes were made.  TODAY He feels very well. 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 No CP, SOB, DOE. No near syncope or syncope. No PVCs/palpitations as long as he takes his flecainide, "works perfectly"  His Birthday week last week, and did indulge with his diet, reports he checks in on his BP intermittently and typically quite hood    Past Medical History:  Diagnosis Date   Blindness of left eye    SECONDARY TO A MAGNOLIA TREE PRICKLY BUD ACCIDENT AT AGE 43   BPH (benign prostatic hyperplasia)    Bradycardia    Complication of anesthesia    Hard to wake up   ED (erectile dysfunction)    History of exercise intolerance dr Curt Bears   12-10-2017  normal exercise tolerence,  no evidence ishemia by ST segment analysis   Hyperlipidemia    Irregular heart beat    LVH (left ventricular hypertrophy) 2019   Mild, noted on ECHO   MR (mitral regurgitation) 2019   Mild, noted on ECHO   OA (osteoarthritis)    LEFT KNEE   PAC (premature atrial contraction)    Pre-diabetes    PVCs (premature ventricular contractions)    cardiology EP -- dr Curt Bears--  holter monitor w/  PVCs 26% beats couplets occasional NSVT only 3 beats   Rotator cuff tear, right    Seasonal allergies    Skin cancer    top of head  Spinal stenosis, cervical region    Mild   Umbilical hernia    Wears glasses     Past Surgical History:  Procedure Laterality Date   ANTERIOR CERVICAL DECOMP/DISCECTOMY FUSION  07/2017   C6-7   CARDIOVASCULAR STRESS TEST  11-15-2017   dr Johnsie Cancel   Low risk nuclear study w/ probable inferoseptal and apical thinning but no significant ischemia;  study no gated due to ectopy   COLONOSCOPY     EYE SURGERY Left x2 @ age 87   injury--  and  Catarat sx @ age 49 and 57 approx.   KNEE ARTHROSCOPY Left 1980s   KNEE ARTHROSCOPY Left 12/23/2017   Procedure: Left knee  arthroscopy, debridement removal loose bodies, chondroplasty and partial lateral meniscectomy;  Surgeon: Sydnee Cabal, MD;  Location: [facility];  Service: Orthopedics;  Laterality: Left;  45 mins / to stay over night monitor floor cardiology per [place] ARTHROSCOPY WITH ROTATOR CUFF REPAIR Right 01/10/2019   Procedure: SHOULDER ARTHROSCOPY WITH ROTATOR CUFF REPAIR biceps tenodesis, subacromial decompression, distal calvicle excision;  Surgeon: Sydnee Cabal, MD;  Location: [facility];  Service: Orthopedics;  Laterality: Right;  Interscalene block   TRANSTHORACIC ECHOCARDIOGRAM  11-15-2017   dr Johnsie Cancel   mild LVH, ef 55-60%/  mild MR, PR and TR   UMBILICAL HERNIA REPAIR N/A 07/18/2018   Procedure: UMBILICAL HERNIA REPAIR;  Surgeon: Rolm Bookbinder, MD;  Location: [facility];  Service: General;  Laterality: N/A;  GENERAL AND TAP BLOCK    Current Outpatient Medications  Medication Sig Dispense Refill   acetaminophen (TYLENOL) 500 MG tablet Take 1,000 mg by mouth every 6 (six) hours as needed for moderate pain.     finasteride (PROSCAR) 5 MG tablet Take 5 mg by mouth daily.   0   flecainide (TAMBOCOR) 100 MG tablet Take 1 tablet (100 mg total) by mouth 2 (two) times daily. 30 tablet 0   Glucos-Chond-Hyal Ac-Ca Fructo (MOVE FREE JOINT HEALTH ADVANCE PO) Take 1 tablet by mouth daily.     MAGNESIUM PO Take 2 tablets by mouth daily.      Omega-3 Fatty Acids (FISH OIL PO) Take 1 tablet by mouth daily.      sildenafil (REVATIO) 20 MG tablet Take 20 mg by mouth as needed (for erectile dysfunction).      tamsulosin (FLOMAX) 0.4 MG CAPS capsule Take 0.4 mg by mouth daily.   0   No current facility-administered medications for this visit.    Allergies:   Patient has no known allergies.   Social History:  The patient  reports that he quit smoking about 47 years ago. His smoking use included cigarettes. He has never used smokeless  tobacco. He reports that he does not currently use alcohol. He reports that he does not use drugs.   Family History:  The patient's family history includes Healthy in his daughter, father, mother, and son.  ROS:  Please see the history of present illness.  All other systems are reviewed and otherwise negative.   PHYSICAL EXAM:  VS:  BP (!) 160/80 (BP Location: Left Arm, Patient Position: Sitting, Cuff Size: Large)   Pulse 74   Ht '6\' 1"'$  (1.854 m)   Wt 231 lb 9.6 oz (105.1 kg)   SpO2 96%   BMI 30.56 kg/m  BMI: Body mass index is 30.56 kg/m. Well nourished, well developed, in no acute distress  HEENT: normocephalic, atraumatic  Neck: no  JVD, carotid bruits or masses Cardiac: RRR; no significant murmurs, no rubs, or gallops Lungs:   CTA b/l, no wheezing, rhonchi or rales  Abd: soft, nontender MS: no deformity or atrophy Ext: trace edema b/l Skin: warm and dry, no rash Neuro:  No gross deficits appreciated Psych: euthymic mood, full affect    EKG:  Done today and reviewed by myself:  SR 74bpm, intervals look ok 04/05/20: SR 87bpm, stable intervals 10/02/2019: SR 67bpm, stable intervals   11/15/2017: TTE Study Conclusions  - Left ventricle: The cavity size was normal. Wall thickness was    increased in a pattern of mild LVH. Systolic function was normal.    The estimated ejection fraction was in the range of 55% to 60%.    Wall motion was normal; there were no regional wall motion    abnormalities. Left ventricular diastolic function parameters    were normal.  - Mitral valve: There was mild regurgitation.  - Atrial septum: No defect or patent foramen ovale was identified.    12/11/2019: EST: (on flecainide) Blood pressure demonstrated a hypertensive response to exercise. There was no ST segment deviation noted during stress.   1. Normal exercise tolerance.  2. No evidence for ischemia by ST segment analysis.    May 2019: 48hr Holter NSR PVC;s 26% of beats couplets  occasional NSVT only 3 beats  PAC;s   Not a candidate for beta blocker due to resting bradycardia F/u with EP to consider AAT to suppress  Recent Labs: No results found for requested labs within last 365 days.  No results found for requested labs within last 365 days.   CrCl cannot be calculated (Patient's most recent lab result is older than the maximum 21 days allowed.).   Wt Readings from Last 3 Encounters:  02/05/22 231 lb 9.6 oz (105.1 kg)  10/30/20 238 lb (108 kg)  04/05/20 233 lb (105.7 kg)     Other studies reviewed: Additional studies/records reviewed today include: summarized above  ASSESSMENT AND PLAN:  1. PVCs     On flecainide     Not felt a candidate for nodal blocking agent with some baseline bradycardia     Stable intervals  2. HTN ?     A recheck on his BP by myself 136/70     Likely a salt load last week     Asked to monitor BP more frequently the next couple weeks, if elevated let us/PMD know   3. HLD     Monitored and managed by his PMD    Disposition:we can continue annual visits, sooner if needed   Current medicines are reviewed at length with the patient today.  The patient did not have any concerns regarding medicines.  Venetia Night, PA-C 02/05/2022 8:33 AM     [address] [place] [address] [phone] (office)  [phone] (fax)

## 2022-02-05 ENCOUNTER — Ambulatory Visit (INDEPENDENT_AMBULATORY_CARE_PROVIDER_SITE_OTHER): Payer: BC Managed Care – PPO | Admitting: Physician Assistant

## 2022-02-05 ENCOUNTER — Encounter: Payer: Self-pay | Admitting: Physician Assistant

## 2022-02-05 VITALS — BP 136/70 | HR 74 | Ht 73.0 in | Wt 231.6 lb

## 2022-02-05 DIAGNOSIS — Z5181 Encounter for therapeutic drug level monitoring: Secondary | ICD-10-CM

## 2022-02-05 DIAGNOSIS — I158 Other secondary hypertension: Secondary | ICD-10-CM | POA: Diagnosis not present

## 2022-02-05 DIAGNOSIS — Z79899 Other long term (current) drug therapy: Secondary | ICD-10-CM | POA: Diagnosis not present

## 2022-02-05 DIAGNOSIS — I493 Ventricular premature depolarization: Secondary | ICD-10-CM | POA: Diagnosis not present

## 2022-02-05 MED ORDER — FLECAINIDE ACETATE 100 MG PO TABS: 100.0000 mg | ORAL_TABLET | Freq: Two times a day (BID) | ORAL | 3 refills | Status: DC

## 2022-02-05 NOTE — Patient Instructions (Signed)
Medication Instructions:  Your physician recommends that you continue on your current medications as directed. Please refer to the Current Medication list given to you today.  *If you need a refill on your cardiac medications before your next appointment, please call your pharmacy*   Lab Work: None  If you have labs (blood work) drawn today and your tests are completely normal, you will receive your results only by: [place] (if you have MyChart) OR A paper copy in the mail If you have any lab test that is abnormal or we need to change your treatment, we will call you to review the results.   Follow-Up: At [facility], you and your health needs are our priority.  As part of our continuing mission to provide you with exceptional heart care, we have created designated Provider Care Teams.  These Care Teams include your primary Cardiologist (physician) and Advanced Practice Providers (APPs -  Physician Assistants and Nurse Practitioners) who all work together to provide you with the care you need, when you need it.   Your next appointment:   1 year(s)  The format for your next appointment:   In Person  Provider:   You may see Will Meredith Leeds, MD or one of the following Advanced Practice Providers on your designated Care Team:   Tommye Standard, [place]

## 2022-08-06 DIAGNOSIS — Z0000 Encounter for general adult medical examination without abnormal findings: Secondary | ICD-10-CM | POA: Diagnosis not present

## 2022-08-06 DIAGNOSIS — I493 Ventricular premature depolarization: Secondary | ICD-10-CM | POA: Diagnosis not present

## 2022-08-06 DIAGNOSIS — N529 Male erectile dysfunction, unspecified: Secondary | ICD-10-CM | POA: Diagnosis not present

## 2022-08-06 DIAGNOSIS — Z125 Encounter for screening for malignant neoplasm of prostate: Secondary | ICD-10-CM | POA: Diagnosis not present

## 2022-08-06 DIAGNOSIS — E7800 Pure hypercholesterolemia, unspecified: Secondary | ICD-10-CM | POA: Diagnosis not present

## 2022-08-06 DIAGNOSIS — R7303 Prediabetes: Secondary | ICD-10-CM | POA: Diagnosis not present

## 2022-08-31 DIAGNOSIS — Z125 Encounter for screening for malignant neoplasm of prostate: Secondary | ICD-10-CM | POA: Diagnosis not present

## 2022-09-07 DIAGNOSIS — N5201 Erectile dysfunction due to arterial insufficiency: Secondary | ICD-10-CM | POA: Diagnosis not present

## 2022-09-07 DIAGNOSIS — R3914 Feeling of incomplete bladder emptying: Secondary | ICD-10-CM | POA: Diagnosis not present

## 2022-09-07 DIAGNOSIS — Z125 Encounter for screening for malignant neoplasm of prostate: Secondary | ICD-10-CM | POA: Diagnosis not present

## 2023-02-08 ENCOUNTER — Other Ambulatory Visit: Payer: Self-pay | Admitting: Physician Assistant

## 2023-02-10 DIAGNOSIS — H40013 Open angle with borderline findings, low risk, bilateral: Secondary | ICD-10-CM | POA: Diagnosis not present

## 2023-02-10 DIAGNOSIS — H5211 Myopia, right eye: Secondary | ICD-10-CM | POA: Diagnosis not present

## 2023-02-11 NOTE — Progress Notes (Signed)
Cardiology Office Note Date:  02/11/2023  Patient ID:  Logan Diaz, Logan Diaz 12/26/1955, MRN [id number] PCP:  Tally Joe, MD  Cardiologist:  Dr. Eden Emms Electrophysiologist: Dr. Elberta Fortis    Chief Complaint:   annual visit  History of Present Illness: Logan Diaz is a 67 y.o. male with history of HLD, HTN, blind L eye (childhood accident), PVCs  He saw [name], [place] Sept 2020.  At that time doing well, had no complaints. His BP was high, though reported better at home.  No changes were made to his regime.  I saw him 10/02/19 He feels well.  Denies any kind of cardiac awareness (never did feel his PVCs), no CP, palpitations.  No SOB, DOE.  No exertional intolerances. 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 No dizzy spells, near syncope or syncope. He inquires about OTC testosterone replacement type products reports some generalized lack of energy.  Reports his testosterone measured low end of normal range, and he suspects this as the culprit.  He is not physically limited. his PMD did not prescribe anything.  And asked he inquire with [name]. He was advised to keep a sleep diary, discussed importance of adequate sleep, advised against using any products with stimulants in it and to confer with his pharmacist prior to taking any products prescribed or OTC that is is ok with his medicines, Flecainide.  I saw him 03/2020 He reports feeling very well.  Opted not to pursue testosterone replacement. 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 His PVCs are very well controlled, states even if a couple hours late he will start to feel some PVCs and shortly after taking his flecainide, they simmer right down. He is very happy with his current regime. No dizzy spells, near syncope or syncope. PMD does annual labs Stable intervals, no changes made  He saw Dr. Elberta Fortis 10/30/2020, doing well, can tell if he has missed flecainide with recurrent  PVCs.otherwise well controlled, no changes were made.  I saw him 02/05/22 He feels very well. 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 No CP, SOB, DOE. No near syncope or syncope. No PVCs/palpitations as long as he takes his flecainide, "works perfectly" His Birthday week last week, and did indulge with his diet, reports he checks in on his BP intermittently and typically quite hood Advised to monitor his BP at home Stable intervals Planned for another annual visit  TODAY He continues  to feel very well Works constructions and very physically active. NO CP, SOB, DOE No exertional intolerances. Rare palpitations and only when overdue for his flecainide dose No near syncope or syncope.   Past Medical History:  Diagnosis Date   Blindness of left eye    SECONDARY TO A MAGNOLIA TREE PRICKLY BUD ACCIDENT AT AGE 11   BPH (benign prostatic hyperplasia)    Bradycardia    Complication of anesthesia    Hard to wake up   ED (erectile dysfunction)    History of exercise intolerance dr Elberta Fortis   12-10-2017  normal exercise tolerence,  no evidence ishemia by ST segment analysis   Hyperlipidemia    Irregular heart beat    LVH (left ventricular hypertrophy) 2019   Mild, noted on ECHO   MR (mitral regurgitation) 2019   Mild, noted on ECHO   OA (osteoarthritis)    LEFT KNEE   PAC (premature atrial contraction)    Pre-diabetes    PVCs (premature ventricular contractions)  cardiology EP -- dr Elberta Fortis--  holter monitor w/  PVCs 26% beats couplets occasional NSVT only 3 beats   Rotator cuff tear, right    Seasonal allergies    Skin cancer    top of head   Spinal stenosis, cervical region    Mild   Umbilical hernia    Wears glasses     Past Surgical History:  Procedure Laterality Date   ANTERIOR CERVICAL DECOMP/DISCECTOMY FUSION  07/2017   C6-7   CARDIOVASCULAR STRESS TEST  11-15-2017   dr Eden Emms   Low risk nuclear study w/ probable  inferoseptal and apical thinning but no significant ischemia;  study no gated due to ectopy   COLONOSCOPY     EYE SURGERY Left x2 @ age 69   injury--  and  Catarat sx @ age 31 and 46 approx.   KNEE ARTHROSCOPY Left 1980s   KNEE ARTHROSCOPY Left 12/23/2017   Procedure: Left knee arthroscopy, debridement removal loose bodies, chondroplasty and partial lateral meniscectomy;  Surgeon: Eugenia Mcalpine, MD;  Location: [facility];  Service: Orthopedics;  Laterality: Left;  45 mins / to stay over night monitor floor cardiology per [facility]   LUMBAR DISC SURGERY  1990s   SHOULDER ARTHROSCOPY WITH ROTATOR CUFF REPAIR Right 01/10/2019   Procedure: SHOULDER ARTHROSCOPY WITH ROTATOR CUFF REPAIR biceps tenodesis, subacromial decompression, distal calvicle excision;  Surgeon: Eugenia Mcalpine, MD;  Location: [facility];  Service: Orthopedics;  Laterality: Right;  Interscalene block   TRANSTHORACIC ECHOCARDIOGRAM  11-15-2017   dr Eden Emms   mild LVH, ef 55-60%/  mild MR, PR and TR   UMBILICAL HERNIA REPAIR N/A 07/18/2018   Procedure: UMBILICAL HERNIA REPAIR;  Surgeon: Emelia Loron, MD;  Location: [facility];  Service: General;  Laterality: N/A;  GENERAL AND TAP BLOCK    Current Outpatient Medications  Medication Sig Dispense Refill   acetaminophen (TYLENOL) 500 MG tablet Take 1,000 mg by mouth every 6 (six) hours as needed for moderate pain.     finasteride (PROSCAR) 5 MG tablet Take 5 mg by mouth daily.   0   flecainide (TAMBOCOR) 100 MG tablet TAKE 1 TABLET BY MOUTH TWICE A DAY 180 tablet 0   Glucos-Chond-Hyal Ac-Ca Fructo (MOVE FREE JOINT HEALTH ADVANCE PO) Take 1 tablet by mouth daily.     MAGNESIUM PO Take 2 tablets by mouth daily.      Omega-3 Fatty Acids (FISH OIL PO) Take 1 tablet by mouth daily.      sildenafil (REVATIO) 20 MG tablet Take 20 mg by mouth as needed (for erectile dysfunction).      tamsulosin (FLOMAX) 0.4 MG CAPS capsule Take 0.4 mg by mouth daily.   0    No current facility-administered medications for this visit.    Allergies:   Patient has no known allergies.   Social History:  The patient  reports that he quit smoking about 48 years ago. His smoking use included cigarettes. He started smoking about 50 years ago. He has never used smokeless tobacco. He reports that he does not currently use alcohol. He reports that he does not use drugs.   Family History:  The patient's family history includes Healthy in his daughter, father, mother, and son.  ROS:  Please see the history of present illness.  All other systems are reviewed and otherwise negative.   PHYSICAL EXAM:  VS:  There were no vitals taken for this visit. BMI: There is no height or weight on file to calculate  BMI. 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 though can see shapes/some color with it) normocephalic, atraumatic  Neck: no JVD, carotid bruits or masses Cardiac: RRR; no significant murmurs, no rubs, or gallops Lungs:  CTA b/l, no wheezing, rhonchi or rales  Abd: soft, nontender MS: no deformity or atrophy Ext: trace edema b/l, a couple varicosities Skin: warm and dry, no rash Neuro:  No gross deficits appreciated Psych: euthymic mood, full affect    EKG:  Done today and reviewed by myself:  Today     SR 72bpm, stable intervals, no PVCs 02/05/22: SR 74bpm, intervals look ok 04/05/20: SR 87bpm, stable intervals 10/02/2019: SR 67bpm, stable intervals   11/15/2017: TTE Study Conclusions  - Left ventricle: The cavity size was normal. Wall thickness was    increased in a pattern of mild LVH. Systolic function was normal.    The estimated ejection fraction was in the range of 55% to 60%.    Wall motion was normal; there were no regional wall motion    abnormalities. Left ventricular diastolic function parameters    were  normal.  - Mitral valve: There was mild regurgitation.  - Atrial septum: No defect or patent foramen ovale was identified.    12/11/2019: EST: (on flecainide) Blood pressure demonstrated a hypertensive response to exercise. There was no ST segment deviation noted during stress.   1. Normal exercise tolerance.  2. No evidence for ischemia by ST segment analysis.    May 2019: 48hr Holter NSR PVC;s 26% of beats couplets occasional NSVT only 3 beats  PAC;s   Not a candidate for beta blocker due to resting bradycardia F/u with EP to consider AAT to suppress  Recent Labs: No results found for requested labs within last 365 days.  No results found for requested labs within last 365 days.   CrCl cannot be calculated (Patient's most recent lab result is older than the maximum 21 days allowed.).   Wt Readings from Last 3 Encounters:  02/05/22 231 lb 9.6 oz (105.1 kg)  10/30/20 238 lb (108 kg)  04/05/20 233 lb (105.7 kg)     Other studies reviewed: Additional studies/records reviewed today include: summarized above  ASSESSMENT AND PLAN:  1. PVCs     On flecainide     Not felt a candidate for nodal blocking agent with some baseline bradycardia     Stable intervals  2. HTN ?    He checks his BP at home every few weeks usually, gets 120's/70s    I asked him to check his BP a few times a week for a month and send a mychart message letting us know how it is   3. HLD     Monitored and managed by his PMD    Disposition: continue annual visits with EP, sooner if needed   Current medicines are reviewed at length with the patient today.  The patient did not have any concerns regarding medicines.  Norma Fredrickson, PA-C 02/11/2023 1:24 PM     [facility] [address] [place] [address] [phone] (office)  [phone] (fax)

## 2023-02-12 ENCOUNTER — Encounter: Payer: Self-pay | Admitting: Physician Assistant

## 2023-02-12 ENCOUNTER — Ambulatory Visit: Payer: Medicare HMO | Attending: Physician Assistant | Admitting: Physician Assistant

## 2023-02-12 VITALS — BP 156/82 | HR 72 | Ht 73.0 in | Wt 232.8 lb

## 2023-02-12 DIAGNOSIS — Z79899 Other long term (current) drug therapy: Secondary | ICD-10-CM

## 2023-02-12 DIAGNOSIS — I493 Ventricular premature depolarization: Secondary | ICD-10-CM | POA: Diagnosis not present

## 2023-02-12 DIAGNOSIS — Z5181 Encounter for therapeutic drug level monitoring: Secondary | ICD-10-CM

## 2023-02-12 NOTE — Patient Instructions (Signed)
Medication Instructions:   Your physician recommends that you continue on your current medications as directed. Please refer to the Current Medication list given to you today.  *If you need a refill on your cardiac medications before your next appointment, please call your pharmacy*    Follow-Up: At [facility], you and your health needs are our priority.  As part of our continuing mission to provide you with exceptional heart care, we have created designated Provider Care Teams.  These Care Teams include your primary Cardiologist (physician) and Advanced Practice Providers (APPs -  Physician Assistants and Nurse Practitioners) who all work together to provide you with the care you need, when you need it.  We recommend signing up for the patient portal called "MyChart".  Sign up information is provided on this After Visit Summary.  MyChart is used to connect with patients for Virtual Visits (Telemedicine).  Patients are able to view lab/test results, encounter notes, upcoming appointments, etc.  Non-urgent messages can be sent to your provider as well.   To learn more about what you can do with MyChart, go to [email or web address].    Your next appointment:   1 year(s)  Provider:   Dr. Elberta Fortis

## 2023-05-07 DIAGNOSIS — M1712 Unilateral primary osteoarthritis, left knee: Secondary | ICD-10-CM | POA: Diagnosis not present

## 2023-05-11 ENCOUNTER — Other Ambulatory Visit: Payer: Self-pay | Admitting: *Deleted

## 2023-05-11 MED ORDER — FLECAINIDE ACETATE 100 MG PO TABS: 100.0000 mg | ORAL_TABLET | Freq: Two times a day (BID) | ORAL | 0 refills | Status: DC

## 2023-05-17 ENCOUNTER — Other Ambulatory Visit: Payer: Self-pay

## 2023-05-17 MED ORDER — FLECAINIDE ACETATE 100 MG PO TABS: 100.0000 mg | ORAL_TABLET | Freq: Two times a day (BID) | ORAL | 2 refills | Status: DC

## 2023-05-25 DIAGNOSIS — M170 Bilateral primary osteoarthritis of knee: Secondary | ICD-10-CM | POA: Diagnosis not present

## 2023-06-16 DIAGNOSIS — M1712 Unilateral primary osteoarthritis, left knee: Secondary | ICD-10-CM | POA: Diagnosis not present

## 2023-06-23 DIAGNOSIS — M170 Bilateral primary osteoarthritis of knee: Secondary | ICD-10-CM | POA: Diagnosis not present

## 2023-06-30 DIAGNOSIS — M170 Bilateral primary osteoarthritis of knee: Secondary | ICD-10-CM | POA: Diagnosis not present

## 2023-07-08 DIAGNOSIS — M1711 Unilateral primary osteoarthritis, right knee: Secondary | ICD-10-CM | POA: Diagnosis not present

## 2023-09-09 DIAGNOSIS — Z1159 Encounter for screening for other viral diseases: Secondary | ICD-10-CM | POA: Diagnosis not present

## 2023-10-08 DIAGNOSIS — Z136 Encounter for screening for cardiovascular disorders: Secondary | ICD-10-CM | POA: Diagnosis not present

## 2023-10-08 DIAGNOSIS — Z87891 Personal history of nicotine dependence: Secondary | ICD-10-CM | POA: Diagnosis not present

## 2023-11-25 DIAGNOSIS — M79641 Pain in right hand: Secondary | ICD-10-CM | POA: Diagnosis not present

## 2023-11-25 DIAGNOSIS — M19041 Primary osteoarthritis, right hand: Secondary | ICD-10-CM | POA: Diagnosis not present

## 2023-11-25 DIAGNOSIS — M19042 Primary osteoarthritis, left hand: Secondary | ICD-10-CM | POA: Diagnosis not present

## 2024-02-09 DIAGNOSIS — H40013 Open angle with borderline findings, low risk, bilateral: Secondary | ICD-10-CM | POA: Diagnosis not present

## 2024-02-17 DIAGNOSIS — L570 Actinic keratosis: Secondary | ICD-10-CM | POA: Diagnosis not present

## 2024-02-17 DIAGNOSIS — Z85828 Personal history of other malignant neoplasm of skin: Secondary | ICD-10-CM | POA: Diagnosis not present

## 2024-02-17 DIAGNOSIS — D692 Other nonthrombocytopenic purpura: Secondary | ICD-10-CM | POA: Diagnosis not present

## 2024-02-17 DIAGNOSIS — D2261 Melanocytic nevi of right upper limb, including shoulder: Secondary | ICD-10-CM | POA: Diagnosis not present

## 2024-02-17 DIAGNOSIS — D225 Melanocytic nevi of trunk: Secondary | ICD-10-CM | POA: Diagnosis not present

## 2024-02-17 DIAGNOSIS — L814 Other melanin hyperpigmentation: Secondary | ICD-10-CM | POA: Diagnosis not present

## 2024-02-17 DIAGNOSIS — L821 Other seborrheic keratosis: Secondary | ICD-10-CM | POA: Diagnosis not present
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
## 2024-02-18 ENCOUNTER — Ambulatory Visit: Attending: Physician Assistant | Admitting: Physician Assistant

## 2024-02-18 VITALS — BP 130/62 | HR 68 | Ht 73.0 in | Wt 236.0 lb

## 2024-02-18 DIAGNOSIS — Z5181 Encounter for therapeutic drug level monitoring: Secondary | ICD-10-CM | POA: Diagnosis not present

## 2024-02-18 DIAGNOSIS — I493 Ventricular premature depolarization: Secondary | ICD-10-CM | POA: Diagnosis not present

## 2024-02-18 DIAGNOSIS — Z79899 Other long term (current) drug therapy: Secondary | ICD-10-CM | POA: Diagnosis not present

## 2024-02-18 MED ORDER — FLECAINIDE ACETATE 100 MG PO TABS: 100.0000 mg | ORAL_TABLET | Freq: Two times a day (BID) | ORAL | 3 refills | Status: DC

## 2024-02-18 NOTE — Patient Instructions (Signed)
 Medication Instructions:   Your physician recommends that you continue on your current medications as directed. Please refer to the Current Medication list given to you today.   *If you need a refill on your cardiac medications before your next appointment, please call your pharmacy*   Lab Work: NONE ORDERED  TODAY     If you have labs (blood work) drawn today and your tests are completely normal, you will receive your results only by: MyChart Message (if you have MyChart) OR A paper copy in the mail If you have any lab test that is abnormal or we need to change your treatment, we will call you to review the results.  Testing/Procedures: NONE ORDERED  TODAY     Follow-Up: At [facility], you and your health needs are our priority.  As part of our continuing mission to provide you with exceptional heart care, our providers are all part of one team.  This team includes your primary Cardiologist (physician) and Advanced Practice Providers or APPs (Physician Assistants and Nurse Practitioners) who all work together to provide you with the care you need, when you need it.  Your next appointment:    1 year(s)  Provider:    You may see Will Cortland Ding, MD or one of the following Advanced Practice Providers on your designated Care Team:   Mertha Abrahams, [place]     We recommend signing up for the patient portal called "MyChart".  Sign up information is provided on this After Visit Summary.  MyChart is used to connect with patients for Virtual Visits (Telemedicine).  Patients are able to view lab/test results, encounter notes, upcoming appointments, etc.  Non-urgent messages can be sent to your provider as well.   To learn more about what you can do with MyChart, go to [email or web address].   Other Instructions

## 2024-05-03 ENCOUNTER — Other Ambulatory Visit: Payer: Self-pay | Admitting: Physician Assistant
